# Patient Record
Sex: Female | Born: 1950 | Race: White | Hispanic: No | Marital: Married | Smoking: Current every day smoker
Health system: Southern US, Community
[De-identification: ages and names within clinical notes are randomized; demographics above are authoritative.]

## PROBLEM LIST (undated history)

## (undated) DIAGNOSIS — F329 Major depressive disorder, single episode, unspecified: Secondary | ICD-10-CM

## (undated) DIAGNOSIS — R251 Tremor, unspecified: Secondary | ICD-10-CM

## (undated) DIAGNOSIS — R0600 Dyspnea, unspecified: Secondary | ICD-10-CM

## (undated) DIAGNOSIS — N189 Chronic kidney disease, unspecified: Secondary | ICD-10-CM

## (undated) DIAGNOSIS — I714 Abdominal aortic aneurysm, without rupture, unspecified: Secondary | ICD-10-CM

## (undated) DIAGNOSIS — F39 Unspecified mood [affective] disorder: Secondary | ICD-10-CM

## (undated) DIAGNOSIS — E785 Hyperlipidemia, unspecified: Secondary | ICD-10-CM

## (undated) DIAGNOSIS — M549 Dorsalgia, unspecified: Secondary | ICD-10-CM

## (undated) DIAGNOSIS — J449 Chronic obstructive pulmonary disease, unspecified: Secondary | ICD-10-CM

## (undated) DIAGNOSIS — F419 Anxiety disorder, unspecified: Secondary | ICD-10-CM

## (undated) DIAGNOSIS — C801 Malignant (primary) neoplasm, unspecified: Secondary | ICD-10-CM

## (undated) DIAGNOSIS — I10 Essential (primary) hypertension: Secondary | ICD-10-CM

## (undated) DIAGNOSIS — M1990 Unspecified osteoarthritis, unspecified site: Secondary | ICD-10-CM

## (undated) HISTORY — DX: Tremor, unspecified: R25.1

## (undated) HISTORY — DX: Hyperlipidemia, unspecified: E78.5

## (undated) HISTORY — DX: Essential (primary) hypertension: I10

## (undated) HISTORY — DX: Dorsalgia, unspecified: M54.9

## (undated) HISTORY — DX: Unspecified mood (affective) disorder: F39

## (undated) HISTORY — DX: Chronic kidney disease, unspecified: N18.9

## (undated) HISTORY — DX: Abdominal aortic aneurysm, without rupture, unspecified: I71.40

## (undated) HISTORY — DX: Malignant (primary) neoplasm, unspecified: C80.1

## (undated) HISTORY — DX: Major depressive disorder, single episode, unspecified: F32.9

## (undated) HISTORY — DX: Chronic obstructive pulmonary disease, unspecified: J44.9

## (undated) HISTORY — DX: Abdominal aortic aneurysm, without rupture: I71.4

---

## 1982-08-03 DIAGNOSIS — J189 Pneumonia, unspecified organism: Secondary | ICD-10-CM

## 1982-08-03 HISTORY — DX: Pneumonia, unspecified organism: J18.9

## 2013-06-16 ENCOUNTER — Other Ambulatory Visit (HOSPITAL_COMMUNITY)
Admission: RE | Admit: 2013-06-16 | Discharge: 2013-06-16 | Disposition: A | Discharge status: Home / Self Care | Payer: Commercial Managed Care - PPO | Source: Ambulatory Visit | Attending: Family Medicine | Admitting: Family Medicine

## 2013-06-16 DIAGNOSIS — Z124 Encounter for screening for malignant neoplasm of cervix: Secondary | ICD-10-CM | POA: Insufficient documentation

## 2013-07-17 ENCOUNTER — Other Ambulatory Visit: Payer: Self-pay

## 2013-07-17 DIAGNOSIS — Z1231 Encounter for screening mammogram for malignant neoplasm of breast: Secondary | ICD-10-CM

## 2013-08-23 ENCOUNTER — Ambulatory Visit
Admission: RE | Admit: 2013-08-23 | Discharge: 2013-08-23 | Disposition: A | Discharge status: Home / Self Care | Payer: Commercial Managed Care - PPO | Source: Ambulatory Visit

## 2013-08-23 DIAGNOSIS — Z1231 Encounter for screening mammogram for malignant neoplasm of breast: Secondary | ICD-10-CM

## 2013-09-21 ENCOUNTER — Other Ambulatory Visit: Payer: Self-pay | Admitting: Family Medicine

## 2013-09-21 ENCOUNTER — Ambulatory Visit
Admission: RE | Admit: 2013-09-21 | Discharge: 2013-09-21 | Disposition: A | Discharge status: Home / Self Care | Payer: Commercial Managed Care - PPO | Source: Ambulatory Visit | Attending: Family Medicine | Admitting: Family Medicine

## 2013-09-21 DIAGNOSIS — E871 Hypo-osmolality and hyponatremia: Secondary | ICD-10-CM

## 2015-06-13 ENCOUNTER — Ambulatory Visit
Admission: RE | Admit: 2015-06-13 | Discharge: 2015-06-13 | Disposition: A | Discharge status: Home / Self Care | Payer: Medicare Other | Source: Ambulatory Visit | Attending: Family Medicine | Admitting: Family Medicine

## 2015-06-13 ENCOUNTER — Other Ambulatory Visit: Payer: Self-pay | Admitting: Family Medicine

## 2015-06-13 DIAGNOSIS — J984 Other disorders of lung: Secondary | ICD-10-CM

## 2015-10-07 ENCOUNTER — Ambulatory Visit
Admission: RE | Admit: 2015-10-07 | Discharge: 2015-10-07 | Disposition: A | Discharge status: Home / Self Care | Payer: Medicare Other | Source: Ambulatory Visit | Attending: Family Medicine | Admitting: Family Medicine

## 2015-10-07 ENCOUNTER — Other Ambulatory Visit: Payer: Self-pay | Admitting: Family Medicine

## 2015-10-07 DIAGNOSIS — J189 Pneumonia, unspecified organism: Secondary | ICD-10-CM

## 2016-08-04 DIAGNOSIS — N183 Chronic kidney disease, stage 3 (moderate): Secondary | ICD-10-CM | POA: Diagnosis not present

## 2016-08-04 DIAGNOSIS — Z0000 Encounter for general adult medical examination without abnormal findings: Secondary | ICD-10-CM | POA: Diagnosis not present

## 2016-08-04 DIAGNOSIS — I129 Hypertensive chronic kidney disease with stage 1 through stage 4 chronic kidney disease, or unspecified chronic kidney disease: Secondary | ICD-10-CM | POA: Diagnosis not present

## 2016-08-04 DIAGNOSIS — Z23 Encounter for immunization: Secondary | ICD-10-CM | POA: Diagnosis not present

## 2016-08-04 DIAGNOSIS — R69 Illness, unspecified: Secondary | ICD-10-CM | POA: Diagnosis not present

## 2016-08-04 DIAGNOSIS — Z720 Tobacco use: Secondary | ICD-10-CM | POA: Diagnosis not present

## 2016-08-04 DIAGNOSIS — E7800 Pure hypercholesterolemia, unspecified: Secondary | ICD-10-CM | POA: Diagnosis not present

## 2016-12-23 DIAGNOSIS — M79601 Pain in right arm: Secondary | ICD-10-CM | POA: Diagnosis not present

## 2016-12-23 DIAGNOSIS — R51 Headache: Secondary | ICD-10-CM | POA: Diagnosis not present

## 2016-12-23 DIAGNOSIS — M79604 Pain in right leg: Secondary | ICD-10-CM | POA: Diagnosis not present

## 2017-01-04 DIAGNOSIS — Z0100 Encounter for examination of eyes and vision without abnormal findings: Secondary | ICD-10-CM | POA: Diagnosis not present

## 2017-08-11 DIAGNOSIS — S20219A Contusion of unspecified front wall of thorax, initial encounter: Secondary | ICD-10-CM | POA: Diagnosis not present

## 2017-08-11 DIAGNOSIS — S8010XA Contusion of unspecified lower leg, initial encounter: Secondary | ICD-10-CM | POA: Diagnosis not present

## 2017-08-11 DIAGNOSIS — R69 Illness, unspecified: Secondary | ICD-10-CM | POA: Diagnosis not present

## 2017-08-16 DIAGNOSIS — H524 Presbyopia: Secondary | ICD-10-CM | POA: Diagnosis not present

## 2017-08-16 DIAGNOSIS — H5213 Myopia, bilateral: Secondary | ICD-10-CM | POA: Diagnosis not present

## 2017-08-16 DIAGNOSIS — H2513 Age-related nuclear cataract, bilateral: Secondary | ICD-10-CM | POA: Diagnosis not present

## 2017-08-17 DIAGNOSIS — C44529 Squamous cell carcinoma of skin of other part of trunk: Secondary | ICD-10-CM | POA: Diagnosis not present

## 2017-08-17 DIAGNOSIS — L821 Other seborrheic keratosis: Secondary | ICD-10-CM | POA: Diagnosis not present

## 2017-08-17 DIAGNOSIS — D485 Neoplasm of uncertain behavior of skin: Secondary | ICD-10-CM | POA: Diagnosis not present

## 2017-08-17 DIAGNOSIS — D1801 Hemangioma of skin and subcutaneous tissue: Secondary | ICD-10-CM | POA: Diagnosis not present

## 2017-09-23 DIAGNOSIS — R69 Illness, unspecified: Secondary | ICD-10-CM | POA: Diagnosis not present

## 2017-10-25 ENCOUNTER — Other Ambulatory Visit: Payer: Self-pay | Admitting: Family Medicine

## 2017-10-25 ENCOUNTER — Ambulatory Visit
Admission: RE | Admit: 2017-10-25 | Discharge: 2017-10-25 | Disposition: A | Discharge status: Home / Self Care | Payer: Medicare HMO | Source: Ambulatory Visit | Attending: Family Medicine | Admitting: Family Medicine

## 2017-10-25 DIAGNOSIS — R69 Illness, unspecified: Secondary | ICD-10-CM | POA: Diagnosis not present

## 2017-10-25 DIAGNOSIS — R0602 Shortness of breath: Secondary | ICD-10-CM | POA: Diagnosis not present

## 2017-10-25 DIAGNOSIS — C4490 Unspecified malignant neoplasm of skin, unspecified: Secondary | ICD-10-CM | POA: Diagnosis not present

## 2017-10-25 DIAGNOSIS — E7800 Pure hypercholesterolemia, unspecified: Secondary | ICD-10-CM | POA: Diagnosis not present

## 2017-10-25 DIAGNOSIS — N183 Chronic kidney disease, stage 3 (moderate): Secondary | ICD-10-CM | POA: Diagnosis not present

## 2017-10-25 DIAGNOSIS — Z720 Tobacco use: Secondary | ICD-10-CM | POA: Diagnosis not present

## 2017-10-25 DIAGNOSIS — Z1159 Encounter for screening for other viral diseases: Secondary | ICD-10-CM | POA: Diagnosis not present

## 2017-10-25 DIAGNOSIS — I129 Hypertensive chronic kidney disease with stage 1 through stage 4 chronic kidney disease, or unspecified chronic kidney disease: Secondary | ICD-10-CM | POA: Diagnosis not present

## 2017-10-25 DIAGNOSIS — R0600 Dyspnea, unspecified: Secondary | ICD-10-CM

## 2017-10-25 DIAGNOSIS — Z0000 Encounter for general adult medical examination without abnormal findings: Secondary | ICD-10-CM | POA: Diagnosis not present

## 2017-12-20 DIAGNOSIS — I10 Essential (primary) hypertension: Secondary | ICD-10-CM | POA: Diagnosis not present

## 2017-12-20 DIAGNOSIS — R251 Tremor, unspecified: Secondary | ICD-10-CM | POA: Diagnosis not present

## 2017-12-20 DIAGNOSIS — R0609 Other forms of dyspnea: Secondary | ICD-10-CM | POA: Diagnosis not present

## 2017-12-20 DIAGNOSIS — C44529 Squamous cell carcinoma of skin of other part of trunk: Secondary | ICD-10-CM | POA: Diagnosis not present

## 2017-12-20 DIAGNOSIS — E782 Mixed hyperlipidemia: Secondary | ICD-10-CM | POA: Diagnosis not present

## 2018-01-11 DIAGNOSIS — I10 Essential (primary) hypertension: Secondary | ICD-10-CM | POA: Diagnosis not present

## 2018-01-11 DIAGNOSIS — R0609 Other forms of dyspnea: Secondary | ICD-10-CM | POA: Diagnosis not present

## 2018-02-08 DIAGNOSIS — T7840XA Allergy, unspecified, initial encounter: Secondary | ICD-10-CM | POA: Diagnosis not present

## 2018-02-08 DIAGNOSIS — R21 Rash and other nonspecific skin eruption: Secondary | ICD-10-CM | POA: Diagnosis not present

## 2018-05-05 DIAGNOSIS — Z720 Tobacco use: Secondary | ICD-10-CM | POA: Diagnosis not present

## 2018-05-05 DIAGNOSIS — J449 Chronic obstructive pulmonary disease, unspecified: Secondary | ICD-10-CM | POA: Diagnosis not present

## 2018-05-05 DIAGNOSIS — R69 Illness, unspecified: Secondary | ICD-10-CM | POA: Diagnosis not present

## 2018-05-05 DIAGNOSIS — R251 Tremor, unspecified: Secondary | ICD-10-CM | POA: Diagnosis not present

## 2018-05-05 DIAGNOSIS — R0600 Dyspnea, unspecified: Secondary | ICD-10-CM | POA: Diagnosis not present

## 2018-07-06 ENCOUNTER — Ambulatory Visit: Payer: Medicare HMO | Admitting: Neurology

## 2019-01-11 DIAGNOSIS — D485 Neoplasm of uncertain behavior of skin: Secondary | ICD-10-CM | POA: Diagnosis not present

## 2019-01-11 DIAGNOSIS — D225 Melanocytic nevi of trunk: Secondary | ICD-10-CM | POA: Diagnosis not present

## 2019-01-11 DIAGNOSIS — L820 Inflamed seborrheic keratosis: Secondary | ICD-10-CM | POA: Diagnosis not present

## 2019-01-11 DIAGNOSIS — D1801 Hemangioma of skin and subcutaneous tissue: Secondary | ICD-10-CM | POA: Diagnosis not present

## 2019-01-11 DIAGNOSIS — Z85828 Personal history of other malignant neoplasm of skin: Secondary | ICD-10-CM | POA: Diagnosis not present

## 2019-01-18 DIAGNOSIS — H524 Presbyopia: Secondary | ICD-10-CM | POA: Diagnosis not present

## 2019-01-18 DIAGNOSIS — H5213 Myopia, bilateral: Secondary | ICD-10-CM | POA: Diagnosis not present

## 2019-01-18 DIAGNOSIS — H52201 Unspecified astigmatism, right eye: Secondary | ICD-10-CM | POA: Diagnosis not present

## 2019-01-18 DIAGNOSIS — H2513 Age-related nuclear cataract, bilateral: Secondary | ICD-10-CM | POA: Diagnosis not present

## 2019-01-19 ENCOUNTER — Ambulatory Visit: Payer: Medicare HMO | Admitting: Neurology

## 2019-01-19 DIAGNOSIS — Z1389 Encounter for screening for other disorder: Secondary | ICD-10-CM | POA: Diagnosis not present

## 2019-01-19 DIAGNOSIS — R69 Illness, unspecified: Secondary | ICD-10-CM | POA: Diagnosis not present

## 2019-01-19 DIAGNOSIS — Z720 Tobacco use: Secondary | ICD-10-CM | POA: Diagnosis not present

## 2019-01-19 DIAGNOSIS — N183 Chronic kidney disease, stage 3 (moderate): Secondary | ICD-10-CM | POA: Diagnosis not present

## 2019-01-19 DIAGNOSIS — R251 Tremor, unspecified: Secondary | ICD-10-CM | POA: Diagnosis not present

## 2019-01-19 DIAGNOSIS — E7800 Pure hypercholesterolemia, unspecified: Secondary | ICD-10-CM | POA: Diagnosis not present

## 2019-01-19 DIAGNOSIS — J449 Chronic obstructive pulmonary disease, unspecified: Secondary | ICD-10-CM | POA: Diagnosis not present

## 2019-01-19 DIAGNOSIS — Z0000 Encounter for general adult medical examination without abnormal findings: Secondary | ICD-10-CM | POA: Diagnosis not present

## 2019-01-19 DIAGNOSIS — I129 Hypertensive chronic kidney disease with stage 1 through stage 4 chronic kidney disease, or unspecified chronic kidney disease: Secondary | ICD-10-CM | POA: Diagnosis not present

## 2019-02-02 ENCOUNTER — Ambulatory Visit: Payer: Medicare HMO | Admitting: Neurology

## 2019-06-22 DIAGNOSIS — M722 Plantar fascial fibromatosis: Secondary | ICD-10-CM | POA: Diagnosis not present

## 2019-06-22 DIAGNOSIS — E569 Vitamin deficiency, unspecified: Secondary | ICD-10-CM | POA: Diagnosis not present

## 2019-06-22 DIAGNOSIS — M545 Low back pain: Secondary | ICD-10-CM | POA: Diagnosis not present

## 2019-06-22 DIAGNOSIS — N183 Chronic kidney disease, stage 3 unspecified: Secondary | ICD-10-CM | POA: Diagnosis not present

## 2019-07-04 ENCOUNTER — Other Ambulatory Visit: Payer: Self-pay | Admitting: Family Medicine

## 2019-07-04 DIAGNOSIS — Z139 Encounter for screening, unspecified: Secondary | ICD-10-CM

## 2019-09-24 ENCOUNTER — Ambulatory Visit: Payer: Medicare HMO | Attending: Internal Medicine

## 2019-09-24 DIAGNOSIS — Z23 Encounter for immunization: Secondary | ICD-10-CM | POA: Insufficient documentation

## 2019-09-24 NOTE — Progress Notes (Signed)
   Covid-19 Vaccination Clinic  Name:  Andrea Little    MRN: [id number] DOB: Mar 31, 1951  09/24/2019  [name] was observed post Covid-19 immunization for 15 minutes without incidence. She was provided with Vaccine Information Sheet and instruction to access the V-Safe system.   [name] was instructed to call 911 with any severe reactions post vaccine: Marland Kitchen Difficulty breathing  . Swelling of your face and throat  . A fast heartbeat  . A bad rash all over your body  . Dizziness and weakness    Immunizations Administered    Name Date Dose VIS Date Route   Pfizer COVID-19 Vaccine 09/24/2019  1:17 PM 0.3 mL 07/14/2019 Intramuscular   Manufacturer: [place]   Lot: [id number]   [name]: [id number]

## 2019-09-28 DIAGNOSIS — M9905 Segmental and somatic dysfunction of pelvic region: Secondary | ICD-10-CM | POA: Diagnosis not present

## 2019-09-28 DIAGNOSIS — M9904 Segmental and somatic dysfunction of sacral region: Secondary | ICD-10-CM | POA: Diagnosis not present

## 2019-09-28 DIAGNOSIS — M9903 Segmental and somatic dysfunction of lumbar region: Secondary | ICD-10-CM | POA: Diagnosis not present

## 2019-09-28 DIAGNOSIS — M5136 Other intervertebral disc degeneration, lumbar region: Secondary | ICD-10-CM | POA: Diagnosis not present

## 2019-10-03 DIAGNOSIS — M9905 Segmental and somatic dysfunction of pelvic region: Secondary | ICD-10-CM | POA: Diagnosis not present

## 2019-10-03 DIAGNOSIS — M9904 Segmental and somatic dysfunction of sacral region: Secondary | ICD-10-CM | POA: Diagnosis not present

## 2019-10-03 DIAGNOSIS — M9903 Segmental and somatic dysfunction of lumbar region: Secondary | ICD-10-CM | POA: Diagnosis not present

## 2019-10-03 DIAGNOSIS — M5136 Other intervertebral disc degeneration, lumbar region: Secondary | ICD-10-CM | POA: Diagnosis not present

## 2019-10-10 DIAGNOSIS — M9905 Segmental and somatic dysfunction of pelvic region: Secondary | ICD-10-CM | POA: Diagnosis not present

## 2019-10-10 DIAGNOSIS — M9904 Segmental and somatic dysfunction of sacral region: Secondary | ICD-10-CM | POA: Diagnosis not present

## 2019-10-10 DIAGNOSIS — M5136 Other intervertebral disc degeneration, lumbar region: Secondary | ICD-10-CM | POA: Diagnosis not present

## 2019-10-10 DIAGNOSIS — M9903 Segmental and somatic dysfunction of lumbar region: Secondary | ICD-10-CM | POA: Diagnosis not present

## 2019-10-18 ENCOUNTER — Ambulatory Visit: Payer: Medicare HMO

## 2019-11-06 ENCOUNTER — Ambulatory Visit: Payer: Medicare HMO | Attending: Internal Medicine

## 2019-11-06 DIAGNOSIS — Z23 Encounter for immunization: Secondary | ICD-10-CM

## 2019-11-06 NOTE — Progress Notes (Signed)
   Covid-19 Vaccination Clinic  Name:  Andrea Little    MRN: [id number] DOB: 15-May-1951  11/06/2019  Andrea Little was observed post Covid-19 immunization for 15 minutes without incident. She was provided with Vaccine Information Sheet and instruction to access the V-Safe system.   Andrea Little was instructed to call 911 with any severe reactions post vaccine: Marland Kitchen Difficulty breathing  . Swelling of face and throat  . A fast heartbeat  . A bad rash all over body  . Dizziness and weakness   Immunizations Administered    Name Date Dose VIS Date Route   Pfizer COVID-19 Vaccine 11/06/2019  1:22 PM 0.3 mL 07/14/2019 Intramuscular   Manufacturer: [employer], [employer]   Lot: [id number]   [place]: [id number]

## 2019-12-14 DIAGNOSIS — I129 Hypertensive chronic kidney disease with stage 1 through stage 4 chronic kidney disease, or unspecified chronic kidney disease: Secondary | ICD-10-CM | POA: Diagnosis not present

## 2019-12-14 DIAGNOSIS — E559 Vitamin D deficiency, unspecified: Secondary | ICD-10-CM | POA: Diagnosis not present

## 2019-12-14 DIAGNOSIS — N183 Chronic kidney disease, stage 3 unspecified: Secondary | ICD-10-CM | POA: Diagnosis not present

## 2019-12-14 DIAGNOSIS — R5383 Other fatigue: Secondary | ICD-10-CM | POA: Diagnosis not present

## 2019-12-14 DIAGNOSIS — R69 Illness, unspecified: Secondary | ICD-10-CM | POA: Diagnosis not present

## 2019-12-14 DIAGNOSIS — M545 Low back pain: Secondary | ICD-10-CM | POA: Diagnosis not present

## 2020-01-10 DIAGNOSIS — M5442 Lumbago with sciatica, left side: Secondary | ICD-10-CM | POA: Diagnosis not present

## 2020-01-10 DIAGNOSIS — M47816 Spondylosis without myelopathy or radiculopathy, lumbar region: Secondary | ICD-10-CM | POA: Diagnosis not present

## 2020-01-10 DIAGNOSIS — M5441 Lumbago with sciatica, right side: Secondary | ICD-10-CM | POA: Diagnosis not present

## 2020-01-10 DIAGNOSIS — M5136 Other intervertebral disc degeneration, lumbar region: Secondary | ICD-10-CM | POA: Diagnosis not present

## 2020-01-10 DIAGNOSIS — M545 Low back pain: Secondary | ICD-10-CM | POA: Diagnosis not present

## 2020-01-10 DIAGNOSIS — M8580 Other specified disorders of bone density and structure, unspecified site: Secondary | ICD-10-CM | POA: Diagnosis not present

## 2020-01-10 DIAGNOSIS — G8929 Other chronic pain: Secondary | ICD-10-CM | POA: Diagnosis not present

## 2020-01-10 DIAGNOSIS — R202 Paresthesia of skin: Secondary | ICD-10-CM | POA: Diagnosis not present

## 2020-01-12 DIAGNOSIS — M810 Age-related osteoporosis without current pathological fracture: Secondary | ICD-10-CM | POA: Diagnosis not present

## 2020-01-12 DIAGNOSIS — Z780 Asymptomatic menopausal state: Secondary | ICD-10-CM | POA: Diagnosis not present

## 2020-01-12 DIAGNOSIS — M8588 Other specified disorders of bone density and structure, other site: Secondary | ICD-10-CM | POA: Diagnosis not present

## 2020-01-22 DIAGNOSIS — R69 Illness, unspecified: Secondary | ICD-10-CM | POA: Diagnosis not present

## 2020-01-22 DIAGNOSIS — J449 Chronic obstructive pulmonary disease, unspecified: Secondary | ICD-10-CM | POA: Diagnosis not present

## 2020-01-22 DIAGNOSIS — N183 Chronic kidney disease, stage 3 unspecified: Secondary | ICD-10-CM | POA: Diagnosis not present

## 2020-01-22 DIAGNOSIS — C4490 Unspecified malignant neoplasm of skin, unspecified: Secondary | ICD-10-CM | POA: Diagnosis not present

## 2020-01-22 DIAGNOSIS — E7800 Pure hypercholesterolemia, unspecified: Secondary | ICD-10-CM | POA: Diagnosis not present

## 2020-01-22 DIAGNOSIS — Z1389 Encounter for screening for other disorder: Secondary | ICD-10-CM | POA: Diagnosis not present

## 2020-01-22 DIAGNOSIS — I129 Hypertensive chronic kidney disease with stage 1 through stage 4 chronic kidney disease, or unspecified chronic kidney disease: Secondary | ICD-10-CM | POA: Diagnosis not present

## 2020-01-22 DIAGNOSIS — Z0000 Encounter for general adult medical examination without abnormal findings: Secondary | ICD-10-CM | POA: Diagnosis not present

## 2020-01-22 DIAGNOSIS — Z23 Encounter for immunization: Secondary | ICD-10-CM | POA: Diagnosis not present

## 2020-02-02 DIAGNOSIS — M8588 Other specified disorders of bone density and structure, other site: Secondary | ICD-10-CM | POA: Diagnosis not present

## 2020-02-02 DIAGNOSIS — E559 Vitamin D deficiency, unspecified: Secondary | ICD-10-CM | POA: Diagnosis not present

## 2020-02-02 DIAGNOSIS — Z7189 Other specified counseling: Secondary | ICD-10-CM | POA: Diagnosis not present

## 2020-02-07 DIAGNOSIS — M5386 Other specified dorsopathies, lumbar region: Secondary | ICD-10-CM | POA: Diagnosis not present

## 2020-02-07 DIAGNOSIS — Z789 Other specified health status: Secondary | ICD-10-CM | POA: Diagnosis not present

## 2020-02-07 DIAGNOSIS — M5136 Other intervertebral disc degeneration, lumbar region: Secondary | ICD-10-CM | POA: Diagnosis not present

## 2020-02-07 DIAGNOSIS — M47816 Spondylosis without myelopathy or radiculopathy, lumbar region: Secondary | ICD-10-CM | POA: Diagnosis not present

## 2020-02-07 DIAGNOSIS — G8929 Other chronic pain: Secondary | ICD-10-CM | POA: Diagnosis not present

## 2020-02-07 DIAGNOSIS — Z7409 Other reduced mobility: Secondary | ICD-10-CM | POA: Diagnosis not present

## 2020-02-07 DIAGNOSIS — M5441 Lumbago with sciatica, right side: Secondary | ICD-10-CM | POA: Diagnosis not present

## 2020-02-07 DIAGNOSIS — R29898 Other symptoms and signs involving the musculoskeletal system: Secondary | ICD-10-CM | POA: Diagnosis not present

## 2020-02-07 DIAGNOSIS — M5442 Lumbago with sciatica, left side: Secondary | ICD-10-CM | POA: Diagnosis not present

## 2020-02-07 DIAGNOSIS — M6281 Muscle weakness (generalized): Secondary | ICD-10-CM | POA: Diagnosis not present

## 2020-05-01 DIAGNOSIS — R69 Illness, unspecified: Secondary | ICD-10-CM | POA: Diagnosis not present

## 2020-05-12 DIAGNOSIS — M545 Low back pain, unspecified: Secondary | ICD-10-CM | POA: Diagnosis not present

## 2020-05-12 DIAGNOSIS — M5136 Other intervertebral disc degeneration, lumbar region: Secondary | ICD-10-CM | POA: Diagnosis not present

## 2020-05-12 DIAGNOSIS — R202 Paresthesia of skin: Secondary | ICD-10-CM | POA: Diagnosis not present

## 2020-05-12 DIAGNOSIS — M5442 Lumbago with sciatica, left side: Secondary | ICD-10-CM | POA: Diagnosis not present

## 2020-05-12 DIAGNOSIS — M5441 Lumbago with sciatica, right side: Secondary | ICD-10-CM | POA: Diagnosis not present

## 2020-05-12 DIAGNOSIS — G8929 Other chronic pain: Secondary | ICD-10-CM | POA: Diagnosis not present

## 2020-05-17 DIAGNOSIS — M5136 Other intervertebral disc degeneration, lumbar region: Secondary | ICD-10-CM | POA: Insufficient documentation

## 2020-05-17 DIAGNOSIS — M545 Low back pain, unspecified: Secondary | ICD-10-CM | POA: Diagnosis not present

## 2020-05-17 DIAGNOSIS — M47816 Spondylosis without myelopathy or radiculopathy, lumbar region: Secondary | ICD-10-CM | POA: Diagnosis not present

## 2020-05-17 DIAGNOSIS — M48062 Spinal stenosis, lumbar region with neurogenic claudication: Secondary | ICD-10-CM | POA: Diagnosis not present

## 2020-05-17 DIAGNOSIS — M51369 Other intervertebral disc degeneration, lumbar region without mention of lumbar back pain or lower extremity pain: Secondary | ICD-10-CM | POA: Insufficient documentation

## 2020-05-17 DIAGNOSIS — M79604 Pain in right leg: Secondary | ICD-10-CM | POA: Diagnosis not present

## 2020-06-04 DIAGNOSIS — M4726 Other spondylosis with radiculopathy, lumbar region: Secondary | ICD-10-CM | POA: Diagnosis not present

## 2020-06-04 DIAGNOSIS — M5116 Intervertebral disc disorders with radiculopathy, lumbar region: Secondary | ICD-10-CM | POA: Diagnosis not present

## 2020-06-04 DIAGNOSIS — M79604 Pain in right leg: Secondary | ICD-10-CM | POA: Insufficient documentation

## 2020-06-04 DIAGNOSIS — Z79899 Other long term (current) drug therapy: Secondary | ICD-10-CM | POA: Diagnosis not present

## 2020-06-04 DIAGNOSIS — I10 Essential (primary) hypertension: Secondary | ICD-10-CM | POA: Diagnosis not present

## 2020-06-04 DIAGNOSIS — M5126 Other intervertebral disc displacement, lumbar region: Secondary | ICD-10-CM | POA: Diagnosis not present

## 2020-06-04 DIAGNOSIS — M545 Low back pain, unspecified: Secondary | ICD-10-CM | POA: Insufficient documentation

## 2020-07-04 DIAGNOSIS — M48062 Spinal stenosis, lumbar region with neurogenic claudication: Secondary | ICD-10-CM | POA: Diagnosis not present

## 2020-07-04 DIAGNOSIS — M47816 Spondylosis without myelopathy or radiculopathy, lumbar region: Secondary | ICD-10-CM | POA: Diagnosis not present

## 2020-07-04 DIAGNOSIS — M545 Low back pain, unspecified: Secondary | ICD-10-CM | POA: Diagnosis not present

## 2020-07-04 DIAGNOSIS — M5136 Other intervertebral disc degeneration, lumbar region: Secondary | ICD-10-CM | POA: Diagnosis not present

## 2020-07-04 DIAGNOSIS — M79604 Pain in right leg: Secondary | ICD-10-CM | POA: Diagnosis not present

## 2020-07-16 DIAGNOSIS — I719 Aortic aneurysm of unspecified site, without rupture: Secondary | ICD-10-CM | POA: Diagnosis not present

## 2020-07-16 DIAGNOSIS — E7800 Pure hypercholesterolemia, unspecified: Secondary | ICD-10-CM | POA: Diagnosis not present

## 2020-07-16 DIAGNOSIS — R109 Unspecified abdominal pain: Secondary | ICD-10-CM | POA: Diagnosis not present

## 2020-07-16 DIAGNOSIS — I129 Hypertensive chronic kidney disease with stage 1 through stage 4 chronic kidney disease, or unspecified chronic kidney disease: Secondary | ICD-10-CM | POA: Diagnosis not present

## 2020-07-16 DIAGNOSIS — E559 Vitamin D deficiency, unspecified: Secondary | ICD-10-CM | POA: Diagnosis not present

## 2020-08-06 DIAGNOSIS — M47816 Spondylosis without myelopathy or radiculopathy, lumbar region: Secondary | ICD-10-CM | POA: Diagnosis not present

## 2020-08-20 ENCOUNTER — Other Ambulatory Visit: Payer: Self-pay

## 2020-08-20 DIAGNOSIS — I714 Abdominal aortic aneurysm, without rupture, unspecified: Secondary | ICD-10-CM

## 2020-09-06 ENCOUNTER — Encounter: Payer: Medicare HMO | Admitting: Vascular Surgery

## 2020-09-06 ENCOUNTER — Other Ambulatory Visit (HOSPITAL_COMMUNITY): Payer: Medicare HMO

## 2020-10-18 ENCOUNTER — Ambulatory Visit (HOSPITAL_COMMUNITY)
Admission: RE | Admit: 2020-10-18 | Discharge: 2020-10-18 | Disposition: A | Discharge status: Home / Self Care | Payer: Medicare HMO | Source: Ambulatory Visit | Attending: Vascular Surgery | Admitting: Vascular Surgery

## 2020-10-18 ENCOUNTER — Other Ambulatory Visit: Payer: Self-pay

## 2020-10-18 ENCOUNTER — Ambulatory Visit (INDEPENDENT_AMBULATORY_CARE_PROVIDER_SITE_OTHER): Payer: Medicare HMO | Admitting: Vascular Surgery

## 2020-10-18 ENCOUNTER — Encounter: Payer: Self-pay | Admitting: Vascular Surgery

## 2020-10-18 VITALS — BP 128/74 | HR 58 | Temp 97.7°F | Resp 20 | Ht 70.0 in | Wt 158.0 lb

## 2020-10-18 DIAGNOSIS — I714 Abdominal aortic aneurysm, without rupture, unspecified: Secondary | ICD-10-CM

## 2020-10-18 NOTE — Progress Notes (Signed)
Patient ID: Andrea Little, female   DOB: 12/22/1950, 70 y.o.   MRN: [id number]  Reason for Consult: New Patient (Initial Visit)   Referred by Kelton Pillar, MD  Subjective:     HPI:  Andrea Little is a 70 y.o. female with history of back pain and recently found to have abdominal aortic aneurysm on MRI.  She has risk factors of smoking, hyperlipidemia hypertension.  She did previously have right lower extremity pain but after injection in her back she does not have any further pain.  She denies tissue loss or ulceration.  She is unaware of any family history of aneurysm disease.  She denies previous stroke, TIA or amaurosis  Past Medical History:  Diagnosis Date  . AAA (abdominal aortic aneurysm) ([place])   . Back pain   . Cancer ([place])    skin  . Chronic kidney disease   . COPD (chronic obstructive pulmonary disease) ([place])   . Hyperlipidemia   . Hypertension   . Major depression   . Mood disorder ([place])   . Tremor    History reviewed. No pertinent family history. History reviewed. No pertinent surgical history.  Short Social History:  Social History   Tobacco Use  . Smoking status: Current Every Day Smoker    Packs/day: 1.00  . Smokeless tobacco: Never Used  Substance Use Topics  . Alcohol use: Not Currently    Allergies  Allergen Reactions  . Atorvastatin     Other reaction(s): leg cramps  . Hydrochlorothiazide     Other reaction(s): hyponatremia  . Penicillin G     Other reaction(s): Other (See Comments) As a child does not recall the reaction     Current Outpatient Medications  Medication Sig Dispense Refill  . acetaminophen (TYLENOL) 500 MG tablet 2 tablets as needed    . ALPRAZolam (XANAX) 1 MG tablet Take 1 mg by mouth 3 (three) times daily as needed.    Marland Kitchen amLODipine (NORVASC) 5 MG tablet Take 5 mg by mouth daily.    Jearl Klinefelter ELLIPTA 62.5-25 MCG/INH AEPB 1 puff daily.    Marland Kitchen atenolol (TENORMIN) 100 MG tablet Take 100 mg by mouth daily.    . benazepril  (LOTENSIN) 40 MG tablet Take 40 mg by mouth daily.    . Calcium Carbonate-Vit D-Min (CALCIUM 1200) 1200-1000 MG-UNIT CHEW 1 tablet    . venlafaxine XR (EFFEXOR-XR) 150 MG 24 hr capsule Take 150 mg by mouth daily. with food    . Vitamin D, Ergocalciferol, (DRISDOL) 1.25 MG (50000 UNIT) CAPS capsule Take 50,000 Units by mouth once a week.    . Vitamin D, Ergocalciferol, (DRISDOL) 1.25 MG (50000 UNIT) CAPS capsule 1 capsule     No current facility-administered medications for this visit.    Review of Systems  Constitutional:  Constitutional negative. HENT: HENT negative.  Eyes: Eyes negative.  Respiratory: Respiratory negative.  Cardiovascular: Cardiovascular negative.  GI: Gastrointestinal negative.  Musculoskeletal: Musculoskeletal negative.  Skin: Skin negative.  Neurological: Neurological negative. Hematologic: Hematologic/lymphatic negative.  Psychiatric: Psychiatric negative.        Objective:  Objective  Vitals:   10/18/20 0959  BP: 128/74  Pulse: (!) 58  Resp: 20  Temp: 97.7 F (36.5 C)  SpO2: 96%    Physical Exam HENT:     Head: Normocephalic.     Nose:     Comments: Wearing a mask Eyes:     Pupils: Pupils are equal, round, and reactive to light.  Neck:  Vascular: No carotid bruit.  Cardiovascular:     Rate and Rhythm: Normal rate.     Pulses:          Carotid pulses are 2+ on the right side and 2+ on the left side.      Radial pulses are 2+ on the right side and 2+ on the left side.       Popliteal pulses are 0 on the right side and 0 on the left side.     Heart sounds: Normal heart sounds.  Pulmonary:     Effort: Pulmonary effort is normal.     Breath sounds: Normal breath sounds.  Abdominal:     Palpations: Abdomen is soft. There is no mass.  Musculoskeletal:        General: Normal range of motion.     Right lower leg: No edema.     Left lower leg: No edema.  Skin:    General: Skin is warm.     Capillary Refill: Capillary refill takes less  than 2 seconds.  Neurological:     General: No focal deficit present.     Mental Status: She is alert.  Psychiatric:        Mood and Affect: Mood normal.        Behavior: Behavior normal.        Thought Content: Thought content normal.        Judgment: Judgment normal.     Data: Abdominal Aorta Findings:  +-----------+-------+----------+----------+--------+--------+--------+  Location  AP (cm)Trans (cm)PSV (cm/s)WaveformThrombusComments  +-----------+-------+----------+----------+--------+--------+--------+  Proximal  1.73  1.97   73                  +-----------+-------+----------+----------+--------+--------+--------+  Mid    3.14  3.44   40                  +-----------+-------+----------+----------+--------+--------+--------+  Distal   1.52  1.66   69                  +-----------+-------+----------+----------+--------+--------+--------+  RT CIA Prox0.9  0.9    109                  +-----------+-------+----------+----------+--------+--------+--------+  LT CIA Prox0.9  0.9    176                  +-----------+-------+----------+----------+--------+--------+--------+    Summary:  Abdominal Aorta: There is evidence of abnormal dilatation of the mid  Abdominal aorta. The largest aortic measurement is 3.4 cm.        Assessment/Plan:    70 year old female here for evaluation of abdominal aortic aneurysm which is small.  This was found incidentally on MRI.  We will follow her up in 2 years with repeat AAA duplex.  We will check ABIs at that time as well although she appears asymptomatic she does not have palpable distal pulses.  I did discuss the signs of rupture as well as the need for smoking cessation and she demonstrates good understanding.      [name] MD [facility]

## 2020-11-06 DIAGNOSIS — H2513 Age-related nuclear cataract, bilateral: Secondary | ICD-10-CM | POA: Diagnosis not present

## 2020-11-06 DIAGNOSIS — H52201 Unspecified astigmatism, right eye: Secondary | ICD-10-CM | POA: Diagnosis not present

## 2020-11-06 DIAGNOSIS — H5213 Myopia, bilateral: Secondary | ICD-10-CM | POA: Diagnosis not present

## 2020-11-06 DIAGNOSIS — H25013 Cortical age-related cataract, bilateral: Secondary | ICD-10-CM | POA: Diagnosis not present

## 2020-11-06 DIAGNOSIS — H524 Presbyopia: Secondary | ICD-10-CM | POA: Diagnosis not present

## 2021-01-22 DIAGNOSIS — I719 Aortic aneurysm of unspecified site, without rupture: Secondary | ICD-10-CM | POA: Diagnosis not present

## 2021-01-22 DIAGNOSIS — E559 Vitamin D deficiency, unspecified: Secondary | ICD-10-CM | POA: Diagnosis not present

## 2021-01-22 DIAGNOSIS — Z1389 Encounter for screening for other disorder: Secondary | ICD-10-CM | POA: Diagnosis not present

## 2021-01-22 DIAGNOSIS — R69 Illness, unspecified: Secondary | ICD-10-CM | POA: Diagnosis not present

## 2021-01-22 DIAGNOSIS — E7800 Pure hypercholesterolemia, unspecified: Secondary | ICD-10-CM | POA: Diagnosis not present

## 2021-01-22 DIAGNOSIS — I700 Atherosclerosis of aorta: Secondary | ICD-10-CM | POA: Diagnosis not present

## 2021-01-22 DIAGNOSIS — Z0000 Encounter for general adult medical examination without abnormal findings: Secondary | ICD-10-CM | POA: Diagnosis not present

## 2021-01-22 DIAGNOSIS — I129 Hypertensive chronic kidney disease with stage 1 through stage 4 chronic kidney disease, or unspecified chronic kidney disease: Secondary | ICD-10-CM | POA: Diagnosis not present

## 2021-01-22 DIAGNOSIS — N183 Chronic kidney disease, stage 3 unspecified: Secondary | ICD-10-CM | POA: Diagnosis not present

## 2021-01-22 DIAGNOSIS — F39 Unspecified mood [affective] disorder: Secondary | ICD-10-CM | POA: Diagnosis not present

## 2021-01-22 DIAGNOSIS — J449 Chronic obstructive pulmonary disease, unspecified: Secondary | ICD-10-CM | POA: Diagnosis not present

## 2021-01-22 DIAGNOSIS — F324 Major depressive disorder, single episode, in partial remission: Secondary | ICD-10-CM | POA: Diagnosis not present

## 2021-02-26 ENCOUNTER — Institutional Professional Consult (permissible substitution): Payer: Medicare HMO | Admitting: Pulmonary Disease

## 2021-02-27 ENCOUNTER — Encounter: Payer: Self-pay | Admitting: Anesthesiology

## 2021-03-27 ENCOUNTER — Institutional Professional Consult (permissible substitution): Payer: Medicare HMO | Admitting: Pulmonary Disease

## 2021-03-31 DIAGNOSIS — G8929 Other chronic pain: Secondary | ICD-10-CM | POA: Diagnosis not present

## 2021-03-31 DIAGNOSIS — M545 Low back pain, unspecified: Secondary | ICD-10-CM | POA: Diagnosis not present

## 2021-03-31 DIAGNOSIS — M79604 Pain in right leg: Secondary | ICD-10-CM | POA: Diagnosis not present

## 2021-03-31 DIAGNOSIS — M47816 Spondylosis without myelopathy or radiculopathy, lumbar region: Secondary | ICD-10-CM | POA: Diagnosis not present

## 2021-03-31 DIAGNOSIS — M25551 Pain in right hip: Secondary | ICD-10-CM | POA: Diagnosis not present

## 2021-04-15 DIAGNOSIS — E871 Hypo-osmolality and hyponatremia: Secondary | ICD-10-CM | POA: Diagnosis not present

## 2021-04-17 ENCOUNTER — Other Ambulatory Visit: Payer: Self-pay | Admitting: Family Medicine

## 2021-04-17 ENCOUNTER — Ambulatory Visit
Admission: RE | Admit: 2021-04-17 | Discharge: 2021-04-17 | Disposition: A | Discharge status: Home / Self Care | Payer: Medicare HMO | Source: Ambulatory Visit | Attending: Family Medicine | Admitting: Family Medicine

## 2021-04-17 DIAGNOSIS — R0602 Shortness of breath: Secondary | ICD-10-CM | POA: Diagnosis not present

## 2021-04-17 DIAGNOSIS — R053 Chronic cough: Secondary | ICD-10-CM | POA: Diagnosis not present

## 2021-04-17 DIAGNOSIS — E871 Hypo-osmolality and hyponatremia: Secondary | ICD-10-CM

## 2021-04-17 DIAGNOSIS — R059 Cough, unspecified: Secondary | ICD-10-CM | POA: Diagnosis not present

## 2021-04-30 ENCOUNTER — Institutional Professional Consult (permissible substitution): Payer: Medicare HMO | Admitting: Pulmonary Disease

## 2021-05-09 DIAGNOSIS — I10 Essential (primary) hypertension: Secondary | ICD-10-CM | POA: Diagnosis not present

## 2021-05-09 DIAGNOSIS — Z720 Tobacco use: Secondary | ICD-10-CM | POA: Diagnosis not present

## 2021-05-09 DIAGNOSIS — J309 Allergic rhinitis, unspecified: Secondary | ICD-10-CM | POA: Diagnosis not present

## 2021-05-09 DIAGNOSIS — J449 Chronic obstructive pulmonary disease, unspecified: Secondary | ICD-10-CM | POA: Diagnosis not present

## 2021-05-20 ENCOUNTER — Telehealth: Payer: Self-pay | Admitting: Pulmonary Disease

## 2021-05-20 NOTE — Telephone Encounter (Signed)
Patient scheduled 05/28/21 with Dr. Silas Flood for COPD.  Per COPD office protocol cbc with diff is needed. I called and spoke with Patient.  Patient stated she had recent labs at her PCP and did not want to have labs drawn at this time. I called [facility] and spoke with Joaquim Lai.  Joaquim Lai is faxing Patient recent labs for OV.

## 2021-05-28 ENCOUNTER — Other Ambulatory Visit: Payer: Self-pay

## 2021-05-28 ENCOUNTER — Encounter: Payer: Self-pay | Admitting: Pulmonary Disease

## 2021-05-28 ENCOUNTER — Ambulatory Visit (INDEPENDENT_AMBULATORY_CARE_PROVIDER_SITE_OTHER): Payer: Medicare HMO | Admitting: Pulmonary Disease

## 2021-05-28 VITALS — BP 130/96 | HR 60 | Temp 98.3°F | Ht 70.0 in | Wt 156.0 lb

## 2021-05-28 DIAGNOSIS — R0609 Other forms of dyspnea: Secondary | ICD-10-CM | POA: Diagnosis not present

## 2021-05-28 NOTE — Patient Instructions (Addendum)
Nice to meet you  We will get pulmonary function tests in the coming days to further evaluate your shortness of breath  Continue Anoro   Based on results of the test we may need to change inhalers  We should consider pulmonary rehab if able.  Slowly increase your exercise - stat with a 5 minute walk at least 3-5 times a week for 2 weeks. Then increase to 10 minutes for 2 weeks. Then continue to increase time walking every 2 weeks to whatever goal you desire. Ideally around 30 minutes or so.  Return to clinic in 3 months or sooner as needed with Dr. Silas Flood

## 2021-05-28 NOTE — Progress Notes (Signed)
@Patient  ID: Andrea Little, female    DOB: 11/06/1950, 70 y.o.   MRN: [id number]  Chief Complaint  Patient presents with   Consult    DOE    Referring provider: Kelton Pillar, MD  HPI:   70 y.o. woman whom we are seeing in consultation for evaluation of dyspnea on exertion.  Note from referring provider reviewed.  Overall, she describes dyspnea on exertion present for last 2 to 3 years.  Gradually worsening.  Used to be more active.  Now rarely leaves the house.  Does not really exert self at all.  Gets dyspneic vacuuming 1 room.  Worse on inclines or stairs.  No time of day when things are better or worse.  No position to make things better or worse.  No seasonal environmental factors she can identify to make things better or worse.  She has been prescribed Anoro.  She does feel active helps with her symptoms.  No other relieving or exacerbating factors.  She continues to smoke, over 50-pack-year smoking history.  Chest x-ray 10/2017 reviewed interpreted as clear lungs with hyperinflation.  Most recent chest imaging chest x-ray 04/2021 reviewed interpreted as clear lungs with hyperinflation, no changes compared to 2019.  She reports echocardiogram in the recent past.  Cannot see results of this.  She states things looked okay per her understanding of the report.  She endorses ongoing anxiety symptoms.  Son is major trigger.  Prescribed venlafaxine as well as alprazolam 1 mg 3 times daily as needed.  She feels anxiety makes her symptoms worse.  PMH: Hypertension, emphysema, hyperlipidemia, seasonal allergies, anxiety Surgical history: Reviewed with patient, she denies any Family history: Emphysema in father, CAD in mother Social history: Current smoker, 1 pack a day, approximate 53-pack-year history, grew up in [place], has lived in [place] since 2008, son still lives in [place] / Pulmonary Flowsheets:   ACT:  No flowsheet data found.  MMRC: No flowsheet data  found.  Epworth:  No flowsheet data found.  Tests:   FENO:  No results found for: NITRICOXIDE  PFT: No flowsheet data found.  WALK:  No flowsheet data found.  Imaging: Personally reviewed and as per EMR discussion this note  Lab Results:  CBC Personally reviewed results 02/2020 via care everywhere No results found for: WBC, RBC, HGB, HCT, PLT, MCV, MCH, MCHC, RDW, LYMPHSABS, MONOABS, EOSABS, BASOSABS  BMET No results found for: NA, K, CL, CO2, GLUCOSE, BUN, CREATININE, CALCIUM, GFRNONAA, GFRAA  BNP No results found for: BNP  ProBNP No results found for: PROBNP  Specialty Problems   None  Allergies  Allergen Reactions   Atorvastatin     Other reaction(s): leg cramps   Hydrochlorothiazide     Other reaction(s): hyponatremia   Penicillin G     Other reaction(s): Other (See Comments) As a child does not recall the reaction     Immunization History  Administered Date(s) Administered   PFIZER(Purple Top)SARS-COV-2 Vaccination 09/24/2019, 11/06/2019, 06/13/2020   Pneumococcal Conjugate-13 08/04/2016   Pneumococcal Polysaccharide-23 01/22/2020   Tdap 06/16/2013    Past Medical History:  Diagnosis Date   AAA (abdominal aortic aneurysm)    Back pain    Cancer ([facility])    skin   Chronic kidney disease    COPD (chronic obstructive pulmonary disease) ([place])    Hyperlipidemia    Hypertension    Major depression    Mood disorder ([place])    Tremor     Tobacco History: Social History  Tobacco Use  Smoking Status Every Day   Packs/day: 1.00   Types: Cigarettes  Smokeless Tobacco Never   Ready to quit: Not Answered Counseling given: Not Answered   Continue to not smoke  Outpatient Encounter Medications as of 05/28/2021  Medication Sig   acetaminophen (TYLENOL) 500 MG tablet 2 tablets as needed   ALPRAZolam (XANAX) 1 MG tablet Take 1 mg by mouth 3 (three) times daily as needed.   amLODipine (NORVASC) 5 MG tablet Take 5 mg by mouth daily.   atenolol  (TENORMIN) 100 MG tablet Take 100 mg by mouth daily.   benazepril (LOTENSIN) 40 MG tablet Take 40 mg by mouth daily.   Calcium Carbonate-Vit D-Min (CALCIUM 1200) 1200-1000 MG-UNIT CHEW 1 tablet   fexofenadine (ALLEGRA) 180 MG tablet Take 180 mg by mouth daily as needed.   venlafaxine XR (EFFEXOR-XR) 150 MG 24 hr capsule Take 150 mg by mouth daily. with food   Vitamin D, Ergocalciferol, (DRISDOL) 1.25 MG (50000 UNIT) CAPS capsule Take 50,000 Units by mouth once a week.   Vitamin D, Ergocalciferol, (DRISDOL) 1.25 MG (50000 UNIT) CAPS capsule 1 capsule   ANORO ELLIPTA 62.5-25 MCG/INH AEPB 1 puff daily.   fluticasone (FLONASE) 50 MCG/ACT nasal spray SMARTSIG:2 Spray(s) Both Nares Daily PRN (Patient not taking: Reported on 05/28/2021)   No facility-administered encounter medications on file as of 05/28/2021.     Review of Systems  Review of Systems  No chest with exertion.  No orthopnea or PND.  Intermittent lower extremity swelling that worsens of the day, improves by morning with elevation.  Comprehensive review of systems otherwise negative. Physical Exam  BP (!) 130/96 (BP Location: Left Arm, Cuff Size: Normal)   Pulse 60   Temp 98.3 F (36.8 C) (Oral)   Ht 5\' 10"  (1.778 m)   Wt 156 lb (70.8 kg)   SpO2 96%   BMI 22.38 kg/m   Wt Readings from Last 5 Encounters:  05/28/21 156 lb (70.8 kg)  10/18/20 158 lb (71.7 kg)    BMI Readings from Last 5 Encounters:  05/28/21 22.38 kg/m  10/18/20 22.67 kg/m     Physical Exam General: Well-appearing, sitting in chair Eyes: EOMI, icterus Neck: Supple, no JVP Pulmonary: Clear, no wheeze, good excursion, good air movement Cardiovascular: Regular rhythm, no murmur MSK: No synovitis, no joint effusion Abdomen: Nondistended, bowel sounds present Neuro: Normal gait, no weakness Psych: Normal mood, flat affect   Assessment & Plan:   DOE: Suspect largely driven by smoking related disease. Prior CXR hyperinflated consistent with likely  emphysema. Anoro helping. To continue Anoro. PFTs for further evaluation. Discussed graduated exercise program. She is keen on pursuing this.  Likely anxiety contributing to severity of symptoms which is understandable as dyspnea can lead to anxiety persistent dyspnea which means to anxiety over and over etc. etc.  Asked her to discuss anxiety and depression symptoms with her PCP.  Would recommend pulmonary rehab if COPD present on PFTs.   Hyperinflation: Seen on chest x-ray.  Suspect related to emphysema.  High likelihood of COPD.  PFTs as above.  To continue Anoro.    Return in about 3 months (around 08/28/2021).   Lanier Clam, MD 05/28/2021

## 2021-08-12 ENCOUNTER — Ambulatory Visit: Payer: Medicare HMO | Admitting: Pulmonary Disease

## 2021-09-10 ENCOUNTER — Encounter: Payer: Self-pay | Admitting: Pulmonary Disease

## 2021-09-10 ENCOUNTER — Other Ambulatory Visit: Payer: Self-pay

## 2021-09-10 ENCOUNTER — Ambulatory Visit (INDEPENDENT_AMBULATORY_CARE_PROVIDER_SITE_OTHER): Payer: Medicare HMO | Admitting: Pulmonary Disease

## 2021-09-10 VITALS — BP 128/76 | HR 68 | Temp 98.0°F | Ht 70.0 in | Wt 155.0 lb

## 2021-09-10 DIAGNOSIS — J438 Other emphysema: Secondary | ICD-10-CM | POA: Diagnosis not present

## 2021-09-10 DIAGNOSIS — R0609 Other forms of dyspnea: Secondary | ICD-10-CM

## 2021-09-10 DIAGNOSIS — J449 Chronic obstructive pulmonary disease, unspecified: Secondary | ICD-10-CM | POA: Insufficient documentation

## 2021-09-10 DIAGNOSIS — Z720 Tobacco use: Secondary | ICD-10-CM

## 2021-09-10 LAB — PULMONARY FUNCTION TEST
DL/VA % pred: 66 %
DL/VA: 2.63 ml/min/mmHg/L
DLCO cor % pred: 51 %
DLCO cor: 12.13 ml/min/mmHg
DLCO unc % pred: 51 %
DLCO unc: 12.13 ml/min/mmHg
FEF 25-75 Post: 0.92 L/sec
FEF 25-75 Pre: 0.72 L/sec
FEF2575-%Change-Post: 28 %
FEF2575-%Pred-Post: 41 %
FEF2575-%Pred-Pre: 32 %
FEV1-%Change-Post: 2 %
FEV1-%Pred-Post: 49 %
FEV1-%Pred-Pre: 47 %
FEV1-Post: 1.39 L
FEV1-Pre: 1.35 L
FEV1FVC-%Change-Post: -5 %
FEV1FVC-%Pred-Pre: 78 %
FEV6-%Change-Post: 6 %
FEV6-%Pred-Post: 68 %
FEV6-%Pred-Pre: 64 %
FEV6-Post: 2.43 L
FEV6-Pre: 2.29 L
FEV6FVC-%Pred-Post: 104 %
FEV6FVC-%Pred-Pre: 104 %
FVC-%Change-Post: 9 %
FVC-%Pred-Post: 67 %
FVC-%Pred-Pre: 61 %
FVC-Post: 2.5 L
FVC-Pre: 2.29 L
Post FEV1/FVC ratio: 56 %
Post FEV6/FVC ratio: 100 %
Pre FEV1/FVC ratio: 59 %
Pre FEV6/FVC Ratio: 100 %

## 2021-09-10 MED ORDER — ANORO ELLIPTA 62.5-25 MCG/ACT IN AEPB: 1.0000 | INHALATION_SPRAY | Freq: Every day | RESPIRATORY_TRACT | 11 refills | Status: AC

## 2021-09-10 NOTE — Patient Instructions (Signed)
Full PFT performed today. °

## 2021-09-10 NOTE — Patient Instructions (Signed)
Nice to see you again  I refilled the Anoro  I recommend gradually and increasing time exercising at home  Walk around the house for 10 minutes twice a day.  Do this for 2 or 3 weeks.  Then every 2 weeks, increase by 5 minutes or so.  Ideally I would like to see you walking up to 30 minutes at a time.  I hope this will help your shortness of breath around the house with normal tasks as well.  Return to clinic in 6 months or sooner as needed with Dr. Silas Flood

## 2021-09-10 NOTE — Progress Notes (Signed)
@Patient  ID: Andrea Little, female    DOB: 1951/01/18, 71 y.o.   MRN: [id number]  Chief Complaint  Patient presents with   Follow-up    Follow up from 3 months ago. Pt did have full pft before visit. Pt states she is still have shortness of breath     Referring provider: Kelton Pillar, MD  HPI:   71 y.o. woman whom we are seeing in follow up for evaluation of dyspnea on exertion.    Returns for routine follow-up.  Dyspnea largely unchanged.  Relates a lot of MSK issues in terms of pain that limits her mobility and activity.  Not very active.  Not exercising.  We discussed graduated exercise program at last visit.  Not doing.  Anoro is somewhat helpful.  Discussed at length pulmonary rehab would likely be the most helpful thing in terms of her symptoms.  She acknowledges this but says that her barriers is not feasible right now.  Reviewed her PFTs today which show on spirometry severe fixed obstruction without significant bronchodilator response, the remainder of test is largely uninterpretable due to difficulty doing maneuvers.  HPI at initial visit: Overall, she describes dyspnea on exertion present for last 2 to 3 years.  Gradually worsening.  Used to be more active.  Now rarely leaves the house.  Does not really exert self at all.  Gets dyspneic vacuuming 1 room.  Worse on inclines or stairs.  No time of day when things are better or worse.  No position to make things better or worse.  No seasonal environmental factors she can identify to make things better or worse.  She has been prescribed Anoro.  She does feel active helps with her symptoms.  No other relieving or exacerbating factors.  She continues to smoke, over 50-pack-year smoking history.  Chest x-ray 10/2017 reviewed interpreted as clear lungs with hyperinflation.  Most recent chest imaging chest x-ray 04/2021 reviewed interpreted as clear lungs with hyperinflation, no changes compared to 2019.  She reports echocardiogram in the  recent past.  Cannot see results of this.  She states things looked okay per her understanding of the report.  She endorses ongoing anxiety symptoms.  Son is major trigger.  Prescribed venlafaxine as well as alprazolam 1 mg 3 times daily as needed.  She feels anxiety makes her symptoms worse.  PMH: Hypertension, emphysema, hyperlipidemia, seasonal allergies, anxiety Surgical history: Reviewed with patient, she denies any Family history: Emphysema in father, CAD in mother Social history: Current smoker, 1 pack a day, approximate 53-pack-year history, grew up in [place], has lived in [place] since 2008, son still lives in [place] / Pulmonary Flowsheets:   ACT:  No flowsheet data found.  MMRC: No flowsheet data found.  Epworth:  No flowsheet data found.  Tests:   FENO:  No results found for: NITRICOXIDE  PFT: PFT Results Latest Ref Rng & Units 09/10/2021  FVC-Pre L 2.29  FVC-Predicted Pre % 61  FVC-Post L 2.50  FVC-Predicted Post % 67  Pre FEV1/FVC % % 59  Post FEV1/FCV % % 56  FEV1-Pre L 1.35  FEV1-Predicted Pre % 47  FEV1-Post L 1.39  DLCO uncorrected ml/min/mmHg 12.13  DLCO UNC% % 51  DLCO corrected ml/min/mmHg 12.13  DLCO COR %Predicted % 51  DLVA Predicted % 66  Personally reviewed, some maneuvers not repeatable or interpretable, lung volumes and DLCO hard to interpret, but spirometry appears repeatable on flow-volume loops and consistent with fixed severe obstruction without significant  bronchodilator response.  WALK:  No flowsheet data found.  Imaging: Personally reviewed and as per EMR discussion this note  Lab Results:  CBC Personally reviewed results 02/2020 via care everywhere No results found for: WBC, RBC, HGB, HCT, PLT, MCV, MCH, MCHC, RDW, LYMPHSABS, MONOABS, EOSABS, BASOSABS  BMET No results found for: NA, K, CL, CO2, GLUCOSE, BUN, CREATININE, CALCIUM, GFRNONAA, GFRAA  BNP No results found for: BNP  ProBNP No results found  for: PROBNP  Specialty Problems       Pulmonary Problems   COPD (chronic obstructive pulmonary disease) ([facility])   Allergies  Allergen Reactions   Atorvastatin     Other reaction(s): leg cramps   Hydrochlorothiazide     Other reaction(s): hyponatremia   Penicillin G     Other reaction(s): Other (See Comments) As a child does not recall the reaction     Immunization History  Administered Date(s) Administered   PFIZER(Purple Top)SARS-COV-2 Vaccination 09/24/2019, 11/06/2019, 06/13/2020   Pneumococcal Conjugate-13 08/04/2016   Pneumococcal Polysaccharide-23 01/22/2020   Tdap 06/16/2013    Past Medical History:  Diagnosis Date   AAA (abdominal aortic aneurysm)    Back pain    Cancer ([facility])    skin   Chronic kidney disease    COPD (chronic obstructive pulmonary disease) ([facility])    Hyperlipidemia    Hypertension    Major depression    Mood disorder ([facility])    Tremor     Tobacco History: Social History   Tobacco Use  Smoking Status Every Day   Packs/day: 1.00   Types: Cigarettes  Smokeless Tobacco Never  Tobacco Comments   1 ppd    Ready to quit: Not Answered Counseling given: Not Answered Tobacco comments: 1 ppd     Continue to not smoke  Outpatient Encounter Medications as of 09/10/2021  Medication Sig   acetaminophen (TYLENOL) 500 MG tablet 2 tablets as needed   ALPRAZolam (XANAX) 1 MG tablet Take 1 mg by mouth 3 (three) times daily as needed.   amLODipine (NORVASC) 5 MG tablet Take 5 mg by mouth daily.   atenolol (TENORMIN) 100 MG tablet Take 100 mg by mouth daily.   benazepril (LOTENSIN) 40 MG tablet Take 40 mg by mouth daily.   Calcium Carbonate-Vit D-Min (CALCIUM 1200) 1200-1000 MG-UNIT CHEW 1 tablet   fexofenadine (ALLEGRA) 180 MG tablet Take 180 mg by mouth daily as needed.   fluticasone (FLONASE) 50 MCG/ACT nasal spray    venlafaxine XR (EFFEXOR-XR) 150 MG 24 hr capsule Take 150 mg by mouth daily. with food   [DISCONTINUED] ANORO ELLIPTA 62.5-25  MCG/INH AEPB 1 puff daily.   [DISCONTINUED] Vitamin D, Ergocalciferol, (DRISDOL) 1.25 MG (50000 UNIT) CAPS capsule Take 50,000 Units by mouth once a week.   [DISCONTINUED] Vitamin D, Ergocalciferol, (DRISDOL) 1.25 MG (50000 UNIT) CAPS capsule 1 capsule   No facility-administered encounter medications on file as of 09/10/2021.     Review of Systems  Review of Systems  No chest with exertion.  No orthopnea or PND.  Intermittent lower extremity swelling that worsens of the day, improves by morning with elevation.  Comprehensive review of systems otherwise negative. Physical Exam  BP 128/76 (BP Location: Left Arm, Patient Position: Sitting, Cuff Size: Normal)    Pulse 68    Temp 98 F (36.7 C) (Oral)    Ht 5\' 10"  (1.778 m)    Wt 155 lb (70.3 kg)    SpO2 97%    BMI 22.24 kg/m   Wt Readings from  Last 5 Encounters:  09/10/21 155 lb (70.3 kg)  05/28/21 156 lb (70.8 kg)  10/18/20 158 lb (71.7 kg)    BMI Readings from Last 5 Encounters:  09/10/21 22.24 kg/m  05/28/21 22.38 kg/m  10/18/20 22.67 kg/m     Physical Exam General: Well-appearing, sitting in chair Eyes: EOMI, icterus Neck: Supple, no JVP Pulmonary: Clear, no wheeze, good excursion, good air movement Cardiovascular: Regular rhythm, no murmur MSK: No synovitis, no joint effusion Abdomen: Nondistended, bowel sounds present Neuro: Normal gait, no weakness Psych: Normal mood, flat affect   Assessment & Plan:   DOE: Suspect largely driven by smoking related disease. Prior CXR hyperinflated consistent with likely emphysema. Anoro helping. PFT today with spirometry looks repeatable consistent with severe COPD with FEV1 47%  without significant bronchodilator response.  Unfortunately lung volumes did not appear consistent and are not able to be interpreted.  Suspect would benefit most from pulmonary rehab.  She does not think it is feasible from the time, cost, MSK pain standpoint.  Recommended gradual exercise program which she  will try to do at home.  Hyperinflation: Seen on chest x-ray.  Suspect related to emphysema.   To continue Anoro.  Tobacco use: Patient is precontemplative, not interested in quitting.  Return in about 6 months (around 03/10/2022).   Lanier Clam, MD 09/10/2021

## 2021-09-10 NOTE — Progress Notes (Signed)
Full PFT performed today. °

## 2021-09-15 DIAGNOSIS — E7800 Pure hypercholesterolemia, unspecified: Secondary | ICD-10-CM | POA: Diagnosis not present

## 2021-09-15 DIAGNOSIS — N183 Chronic kidney disease, stage 3 unspecified: Secondary | ICD-10-CM | POA: Diagnosis not present

## 2021-09-15 DIAGNOSIS — I719 Aortic aneurysm of unspecified site, without rupture: Secondary | ICD-10-CM | POA: Diagnosis not present

## 2021-09-15 DIAGNOSIS — I700 Atherosclerosis of aorta: Secondary | ICD-10-CM | POA: Diagnosis not present

## 2021-09-15 DIAGNOSIS — M5416 Radiculopathy, lumbar region: Secondary | ICD-10-CM | POA: Diagnosis not present

## 2021-09-15 DIAGNOSIS — J449 Chronic obstructive pulmonary disease, unspecified: Secondary | ICD-10-CM | POA: Diagnosis not present

## 2021-09-15 DIAGNOSIS — E559 Vitamin D deficiency, unspecified: Secondary | ICD-10-CM | POA: Diagnosis not present

## 2021-09-15 DIAGNOSIS — I129 Hypertensive chronic kidney disease with stage 1 through stage 4 chronic kidney disease, or unspecified chronic kidney disease: Secondary | ICD-10-CM | POA: Diagnosis not present

## 2021-09-29 DIAGNOSIS — M48062 Spinal stenosis, lumbar region with neurogenic claudication: Secondary | ICD-10-CM | POA: Diagnosis not present

## 2021-09-29 DIAGNOSIS — M545 Low back pain, unspecified: Secondary | ICD-10-CM | POA: Diagnosis not present

## 2021-09-29 DIAGNOSIS — M48061 Spinal stenosis, lumbar region without neurogenic claudication: Secondary | ICD-10-CM | POA: Diagnosis not present

## 2021-09-29 DIAGNOSIS — M79604 Pain in right leg: Secondary | ICD-10-CM | POA: Diagnosis not present

## 2021-10-28 DIAGNOSIS — M47816 Spondylosis without myelopathy or radiculopathy, lumbar region: Secondary | ICD-10-CM | POA: Diagnosis not present

## 2021-10-28 DIAGNOSIS — M5116 Intervertebral disc disorders with radiculopathy, lumbar region: Secondary | ICD-10-CM | POA: Diagnosis not present

## 2021-10-28 DIAGNOSIS — M48062 Spinal stenosis, lumbar region with neurogenic claudication: Secondary | ICD-10-CM | POA: Diagnosis not present

## 2021-10-28 DIAGNOSIS — Z79891 Long term (current) use of opiate analgesic: Secondary | ICD-10-CM | POA: Diagnosis not present

## 2021-10-28 DIAGNOSIS — M79604 Pain in right leg: Secondary | ICD-10-CM | POA: Diagnosis not present

## 2021-10-28 DIAGNOSIS — I10 Essential (primary) hypertension: Secondary | ICD-10-CM | POA: Diagnosis not present

## 2021-11-25 DIAGNOSIS — H5213 Myopia, bilateral: Secondary | ICD-10-CM | POA: Diagnosis not present

## 2021-11-25 DIAGNOSIS — H2513 Age-related nuclear cataract, bilateral: Secondary | ICD-10-CM | POA: Diagnosis not present

## 2021-11-25 DIAGNOSIS — H52201 Unspecified astigmatism, right eye: Secondary | ICD-10-CM | POA: Diagnosis not present

## 2021-11-25 DIAGNOSIS — H25013 Cortical age-related cataract, bilateral: Secondary | ICD-10-CM | POA: Diagnosis not present

## 2021-11-25 DIAGNOSIS — H524 Presbyopia: Secondary | ICD-10-CM | POA: Diagnosis not present

## 2021-11-28 DIAGNOSIS — M48061 Spinal stenosis, lumbar region without neurogenic claudication: Secondary | ICD-10-CM | POA: Diagnosis not present

## 2021-11-28 DIAGNOSIS — M79604 Pain in right leg: Secondary | ICD-10-CM | POA: Diagnosis not present

## 2021-11-28 DIAGNOSIS — M5136 Other intervertebral disc degeneration, lumbar region: Secondary | ICD-10-CM | POA: Diagnosis not present

## 2021-11-28 DIAGNOSIS — M47816 Spondylosis without myelopathy or radiculopathy, lumbar region: Secondary | ICD-10-CM | POA: Diagnosis not present

## 2021-11-28 DIAGNOSIS — M48062 Spinal stenosis, lumbar region with neurogenic claudication: Secondary | ICD-10-CM | POA: Diagnosis not present

## 2021-11-28 DIAGNOSIS — M545 Low back pain, unspecified: Secondary | ICD-10-CM | POA: Diagnosis not present

## 2021-12-07 DIAGNOSIS — M5127 Other intervertebral disc displacement, lumbosacral region: Secondary | ICD-10-CM | POA: Diagnosis not present

## 2021-12-07 DIAGNOSIS — M79604 Pain in right leg: Secondary | ICD-10-CM | POA: Diagnosis not present

## 2021-12-07 DIAGNOSIS — M545 Low back pain, unspecified: Secondary | ICD-10-CM | POA: Diagnosis not present

## 2021-12-07 DIAGNOSIS — M48061 Spinal stenosis, lumbar region without neurogenic claudication: Secondary | ICD-10-CM | POA: Diagnosis not present

## 2021-12-30 DIAGNOSIS — M48061 Spinal stenosis, lumbar region without neurogenic claudication: Secondary | ICD-10-CM | POA: Diagnosis not present

## 2021-12-30 DIAGNOSIS — I714 Abdominal aortic aneurysm, without rupture, unspecified: Secondary | ICD-10-CM | POA: Diagnosis not present

## 2021-12-30 DIAGNOSIS — M5136 Other intervertebral disc degeneration, lumbar region: Secondary | ICD-10-CM | POA: Diagnosis not present

## 2021-12-30 DIAGNOSIS — M533 Sacrococcygeal disorders, not elsewhere classified: Secondary | ICD-10-CM | POA: Diagnosis not present

## 2021-12-30 DIAGNOSIS — M47816 Spondylosis without myelopathy or radiculopathy, lumbar region: Secondary | ICD-10-CM | POA: Diagnosis not present

## 2022-01-28 DIAGNOSIS — M461 Sacroiliitis, not elsewhere classified: Secondary | ICD-10-CM | POA: Diagnosis not present

## 2022-01-28 DIAGNOSIS — R69 Illness, unspecified: Secondary | ICD-10-CM | POA: Diagnosis not present

## 2022-01-28 DIAGNOSIS — Z79899 Other long term (current) drug therapy: Secondary | ICD-10-CM | POA: Diagnosis not present

## 2022-01-28 DIAGNOSIS — M5116 Intervertebral disc disorders with radiculopathy, lumbar region: Secondary | ICD-10-CM | POA: Diagnosis not present

## 2022-01-28 DIAGNOSIS — M47816 Spondylosis without myelopathy or radiculopathy, lumbar region: Secondary | ICD-10-CM | POA: Diagnosis not present

## 2022-01-28 DIAGNOSIS — I10 Essential (primary) hypertension: Secondary | ICD-10-CM | POA: Diagnosis not present

## 2022-02-09 DIAGNOSIS — Z7189 Other specified counseling: Secondary | ICD-10-CM | POA: Diagnosis not present

## 2022-02-09 DIAGNOSIS — M8588 Other specified disorders of bone density and structure, other site: Secondary | ICD-10-CM | POA: Diagnosis not present

## 2022-02-12 ENCOUNTER — Telehealth: Payer: Self-pay

## 2022-02-12 NOTE — Telephone Encounter (Signed)
Pt called stating that she was seen in February of 2021 for an aneurysm and since it was too small for an intervention, she was told to f/u in 2 yrs. She stated that she had another MRI on 12/07/21 at [facility] and it showed the aneursym is bigger. She wanted to know if she needed to make an appt or wait until her 2 yr f/u.  Reviewed pt's chart, returned pt's call for clarification, two identifiers used. Informed pt that there was no MRI images in chart. Asked pt to call her orthopedic surgeon and fax over the MRI report to compare with previous scan to see if appt was necessary. Office fax # given. Confirmed understanding.

## 2022-02-13 DIAGNOSIS — M8580 Other specified disorders of bone density and structure, unspecified site: Secondary | ICD-10-CM | POA: Diagnosis not present

## 2022-02-13 DIAGNOSIS — Z780 Asymptomatic menopausal state: Secondary | ICD-10-CM | POA: Diagnosis not present

## 2022-02-13 DIAGNOSIS — M8589 Other specified disorders of bone density and structure, multiple sites: Secondary | ICD-10-CM | POA: Diagnosis not present

## 2022-02-16 ENCOUNTER — Telehealth: Payer: Self-pay

## 2022-02-16 NOTE — Telephone Encounter (Signed)
Pt called to ensure faxed MRI was received.  Reviewed pt's chart, returned pt's call, two identifiers used. Informed her that we had received that fax and it would be reviewed by Dr Donzetta Matters on Wednesday, 7/19. Confirmed understanding.

## 2022-02-16 NOTE — Telephone Encounter (Signed)
Received faxed MRI report. Forwarding to Dr Donzetta Matters to review.

## 2022-02-21 DIAGNOSIS — M533 Sacrococcygeal disorders, not elsewhere classified: Secondary | ICD-10-CM | POA: Diagnosis not present

## 2022-02-21 DIAGNOSIS — M47816 Spondylosis without myelopathy or radiculopathy, lumbar region: Secondary | ICD-10-CM | POA: Diagnosis not present

## 2022-02-21 DIAGNOSIS — R600 Localized edema: Secondary | ICD-10-CM | POA: Diagnosis not present

## 2022-02-21 DIAGNOSIS — M160 Bilateral primary osteoarthritis of hip: Secondary | ICD-10-CM | POA: Diagnosis not present

## 2022-02-21 DIAGNOSIS — M25551 Pain in right hip: Secondary | ICD-10-CM | POA: Diagnosis not present

## 2022-03-09 DIAGNOSIS — M5136 Other intervertebral disc degeneration, lumbar region: Secondary | ICD-10-CM | POA: Diagnosis not present

## 2022-03-09 DIAGNOSIS — M533 Sacrococcygeal disorders, not elsewhere classified: Secondary | ICD-10-CM | POA: Diagnosis not present

## 2022-03-09 DIAGNOSIS — M47816 Spondylosis without myelopathy or radiculopathy, lumbar region: Secondary | ICD-10-CM | POA: Diagnosis not present

## 2022-03-27 DIAGNOSIS — Z0000 Encounter for general adult medical examination without abnormal findings: Secondary | ICD-10-CM | POA: Diagnosis not present

## 2022-03-27 DIAGNOSIS — E559 Vitamin D deficiency, unspecified: Secondary | ICD-10-CM | POA: Diagnosis not present

## 2022-03-27 DIAGNOSIS — R69 Illness, unspecified: Secondary | ICD-10-CM | POA: Diagnosis not present

## 2022-03-27 DIAGNOSIS — J449 Chronic obstructive pulmonary disease, unspecified: Secondary | ICD-10-CM | POA: Diagnosis not present

## 2022-03-27 DIAGNOSIS — E7800 Pure hypercholesterolemia, unspecified: Secondary | ICD-10-CM | POA: Diagnosis not present

## 2022-03-27 DIAGNOSIS — Z122 Encounter for screening for malignant neoplasm of respiratory organs: Secondary | ICD-10-CM | POA: Diagnosis not present

## 2022-03-27 DIAGNOSIS — N183 Chronic kidney disease, stage 3 unspecified: Secondary | ICD-10-CM | POA: Diagnosis not present

## 2022-03-27 DIAGNOSIS — I700 Atherosclerosis of aorta: Secondary | ICD-10-CM | POA: Diagnosis not present

## 2022-03-27 DIAGNOSIS — R296 Repeated falls: Secondary | ICD-10-CM | POA: Diagnosis not present

## 2022-03-27 DIAGNOSIS — I719 Aortic aneurysm of unspecified site, without rupture: Secondary | ICD-10-CM | POA: Diagnosis not present

## 2022-03-27 DIAGNOSIS — M5416 Radiculopathy, lumbar region: Secondary | ICD-10-CM | POA: Diagnosis not present

## 2022-03-27 DIAGNOSIS — I129 Hypertensive chronic kidney disease with stage 1 through stage 4 chronic kidney disease, or unspecified chronic kidney disease: Secondary | ICD-10-CM | POA: Diagnosis not present

## 2022-03-31 DIAGNOSIS — M5441 Lumbago with sciatica, right side: Secondary | ICD-10-CM | POA: Diagnosis not present

## 2022-03-31 DIAGNOSIS — M47816 Spondylosis without myelopathy or radiculopathy, lumbar region: Secondary | ICD-10-CM | POA: Diagnosis not present

## 2022-03-31 DIAGNOSIS — M5136 Other intervertebral disc degeneration, lumbar region: Secondary | ICD-10-CM | POA: Diagnosis not present

## 2022-04-02 DIAGNOSIS — I700 Atherosclerosis of aorta: Secondary | ICD-10-CM | POA: Diagnosis not present

## 2022-04-02 DIAGNOSIS — I719 Aortic aneurysm of unspecified site, without rupture: Secondary | ICD-10-CM | POA: Diagnosis not present

## 2022-04-02 DIAGNOSIS — E559 Vitamin D deficiency, unspecified: Secondary | ICD-10-CM | POA: Diagnosis not present

## 2022-04-02 DIAGNOSIS — E7800 Pure hypercholesterolemia, unspecified: Secondary | ICD-10-CM | POA: Diagnosis not present

## 2022-04-02 DIAGNOSIS — N183 Chronic kidney disease, stage 3 unspecified: Secondary | ICD-10-CM | POA: Diagnosis not present

## 2022-04-02 DIAGNOSIS — M8588 Other specified disorders of bone density and structure, other site: Secondary | ICD-10-CM | POA: Diagnosis not present

## 2022-04-02 DIAGNOSIS — I129 Hypertensive chronic kidney disease with stage 1 through stage 4 chronic kidney disease, or unspecified chronic kidney disease: Secondary | ICD-10-CM | POA: Diagnosis not present

## 2022-04-08 ENCOUNTER — Other Ambulatory Visit: Payer: Self-pay | Admitting: Internal Medicine

## 2022-04-08 DIAGNOSIS — Z720 Tobacco use: Secondary | ICD-10-CM

## 2022-04-08 DIAGNOSIS — Z122 Encounter for screening for malignant neoplasm of respiratory organs: Secondary | ICD-10-CM

## 2022-04-23 ENCOUNTER — Ambulatory Visit
Admission: RE | Admit: 2022-04-23 | Discharge: 2022-04-23 | Disposition: A | Discharge status: Home / Self Care | Payer: Medicare HMO | Source: Ambulatory Visit | Attending: Internal Medicine | Admitting: Internal Medicine

## 2022-04-23 DIAGNOSIS — R69 Illness, unspecified: Secondary | ICD-10-CM | POA: Diagnosis not present

## 2022-04-23 DIAGNOSIS — D3500 Benign neoplasm of unspecified adrenal gland: Secondary | ICD-10-CM | POA: Diagnosis not present

## 2022-04-23 DIAGNOSIS — Z122 Encounter for screening for malignant neoplasm of respiratory organs: Secondary | ICD-10-CM

## 2022-04-23 DIAGNOSIS — I714 Abdominal aortic aneurysm, without rupture, unspecified: Secondary | ICD-10-CM | POA: Diagnosis not present

## 2022-04-23 DIAGNOSIS — Z720 Tobacco use: Secondary | ICD-10-CM

## 2022-04-23 DIAGNOSIS — E278 Other specified disorders of adrenal gland: Secondary | ICD-10-CM | POA: Diagnosis not present

## 2022-04-23 DIAGNOSIS — F17210 Nicotine dependence, cigarettes, uncomplicated: Secondary | ICD-10-CM | POA: Diagnosis not present

## 2022-04-28 ENCOUNTER — Telehealth: Payer: Self-pay

## 2022-04-28 NOTE — Telephone Encounter (Signed)
-----   Message from [name], MD sent at 04/28/2022 11:44 AM EDT ----- Regarding: RE: CT Results Ok to wait until scheduled folllow up visit.   bc ----- Message ----- From: Gevena Mart, RN Sent: 04/28/2022  11:04 AM EDT To: [name], MD Subject: CT Results                                     Dr. Donzetta Matters, CT results show no lung ca (yay), but shows an increase in AAA from 3.5 cm to 4.1 cm. Pt is to return in Feb/March for surveillance.   Impression #2 The S modifier above refers to a partially visualized infrarenal abdominal aortic aneurysm which measures at least 4.1 cm. As this is only partially visualized recommend further investigation with dedicated ultrasound of the abdominal aorta. If the maximum diameter remains 4.1 cm then follow-up every 12 months and vascular consultation is advised.  Do you want pt to have [place] now (due to above recommendation) or wait until next visit? Thanks, Engineer, materials - Triage

## 2022-04-28 NOTE — Telephone Encounter (Signed)
Andrea Cloud RN with Andrea [facility] at [facility] called with a report concerning this pt.  Reviewed pt's chart, returned call for clarification, two identifiers used. She stated that Dr Doristine Bosworth wanted to ensure that Dr Donzetta Matters was informed about pt's CT results and increase of AAA from 3.5 cm to 4.1 cm. Informed her that Dr Donzetta Matters would be informed and see if pt needed to come back into the office for an [place] or an earlier visit than February. Confirmed understanding.

## 2022-05-14 DIAGNOSIS — M25551 Pain in right hip: Secondary | ICD-10-CM | POA: Diagnosis not present

## 2022-05-14 DIAGNOSIS — M5136 Other intervertebral disc degeneration, lumbar region: Secondary | ICD-10-CM | POA: Diagnosis not present

## 2022-05-14 DIAGNOSIS — M47816 Spondylosis without myelopathy or radiculopathy, lumbar region: Secondary | ICD-10-CM | POA: Diagnosis not present

## 2022-06-10 DIAGNOSIS — I714 Abdominal aortic aneurysm, without rupture, unspecified: Secondary | ICD-10-CM | POA: Diagnosis not present

## 2022-06-10 DIAGNOSIS — R1030 Lower abdominal pain, unspecified: Secondary | ICD-10-CM | POA: Diagnosis not present

## 2022-06-10 DIAGNOSIS — R69 Illness, unspecified: Secondary | ICD-10-CM | POA: Diagnosis not present

## 2022-06-10 DIAGNOSIS — K5909 Other constipation: Secondary | ICD-10-CM | POA: Diagnosis not present

## 2022-06-12 ENCOUNTER — Emergency Department (HOSPITAL_BASED_OUTPATIENT_CLINIC_OR_DEPARTMENT_OTHER)
Admission: EM | Admit: 2022-06-12 | Discharge: 2022-06-12 | Disposition: A | Discharge status: Home / Self Care | Payer: Medicare HMO | Attending: Emergency Medicine | Admitting: Emergency Medicine

## 2022-06-12 ENCOUNTER — Other Ambulatory Visit: Payer: Self-pay

## 2022-06-12 ENCOUNTER — Encounter (HOSPITAL_BASED_OUTPATIENT_CLINIC_OR_DEPARTMENT_OTHER): Payer: Self-pay | Admitting: Emergency Medicine

## 2022-06-12 DIAGNOSIS — R109 Unspecified abdominal pain: Secondary | ICD-10-CM | POA: Diagnosis not present

## 2022-06-12 DIAGNOSIS — E871 Hypo-osmolality and hyponatremia: Secondary | ICD-10-CM | POA: Diagnosis not present

## 2022-06-12 DIAGNOSIS — I700 Atherosclerosis of aorta: Secondary | ICD-10-CM | POA: Diagnosis not present

## 2022-06-12 DIAGNOSIS — Z79899 Other long term (current) drug therapy: Secondary | ICD-10-CM | POA: Insufficient documentation

## 2022-06-12 DIAGNOSIS — K5730 Diverticulosis of large intestine without perforation or abscess without bleeding: Secondary | ICD-10-CM | POA: Diagnosis not present

## 2022-06-12 DIAGNOSIS — I714 Abdominal aortic aneurysm, without rupture, unspecified: Secondary | ICD-10-CM | POA: Insufficient documentation

## 2022-06-12 DIAGNOSIS — K7689 Other specified diseases of liver: Secondary | ICD-10-CM | POA: Diagnosis not present

## 2022-06-12 DIAGNOSIS — E278 Other specified disorders of adrenal gland: Secondary | ICD-10-CM | POA: Diagnosis not present

## 2022-06-12 LAB — COMPREHENSIVE METABOLIC PANEL
ALT: 11 U/L (ref 0–44)
AST: 14 U/L — ABNORMAL LOW (ref 15–41)
Albumin: 4.1 g/dL (ref 3.5–5.0)
Alkaline Phosphatase: 125 U/L (ref 38–126)
Anion gap: 8 (ref 5–15)
BUN: 10 mg/dL (ref 8–23)
CO2: 24 mmol/L (ref 22–32)
Calcium: 9.1 mg/dL (ref 8.9–10.3)
Chloride: 93 mmol/L — ABNORMAL LOW (ref 98–111)
Creatinine, Ser: 1.01 mg/dL — ABNORMAL HIGH (ref 0.44–1.00)
GFR, Estimated: 59 mL/min — ABNORMAL LOW (ref >60)
Glucose, Bld: 128 mg/dL — ABNORMAL HIGH (ref 70–99)
Potassium: 3.9 mmol/L (ref 3.5–5.1)
Sodium: 125 mmol/L — ABNORMAL LOW (ref 135–145)
Total Bilirubin: 0.5 mg/dL (ref 0.3–1.2)
Total Protein: 7.6 g/dL (ref 6.5–8.1)

## 2022-06-12 LAB — CBC WITH DIFFERENTIAL/PLATELET
Abs Immature Granulocytes: 0.06 10*3/uL (ref 0.00–0.07)
Basophils Absolute: 0.1 10*3/uL (ref 0.0–0.1)
Basophils Relative: 1 %
Eosinophils Absolute: 0.1 10*3/uL (ref 0.0–0.5)
Eosinophils Relative: 1 %
HCT: 44.4 % (ref 36.0–46.0)
Hemoglobin: 14.7 g/dL (ref 12.0–15.0)
Immature Granulocytes: 1 %
Lymphocytes Relative: 20 %
Lymphs Abs: 1.9 10*3/uL (ref 0.7–4.0)
MCH: 29.4 pg (ref 26.0–34.0)
MCHC: 33.1 g/dL (ref 30.0–36.0)
MCV: 88.8 fL (ref 80.0–100.0)
Monocytes Absolute: 0.8 10*3/uL (ref 0.1–1.0)
Monocytes Relative: 9 %
Neutro Abs: 6.4 10*3/uL (ref 1.7–7.7)
Neutrophils Relative %: 68 %
Platelets: 282 10*3/uL (ref 150–400)
RBC: 5 MIL/uL (ref 3.87–5.11)
RDW: 12.7 % (ref 11.5–15.5)
WBC: 9.3 10*3/uL (ref 4.0–10.5)
nRBC: 0 % (ref 0.0–0.2)

## 2022-06-12 LAB — PROTIME-INR
INR: 0.9 (ref 0.8–1.2)
Prothrombin Time: 12.5 seconds (ref 11.4–15.2)

## 2022-06-12 LAB — LIPASE, BLOOD: Lipase: 36 U/L (ref 11–51)

## 2022-06-12 MED ORDER — SODIUM CHLORIDE 0.9 % IV BOLUS (SEPSIS)
1000.0000 mL | Freq: Once | INTRAVENOUS | Status: AC
  Administered 2022-06-12: 1000 mL via INTRAVENOUS

## 2022-06-12 NOTE — ED Notes (Signed)
Lt 153/65, rt 157/74

## 2022-06-12 NOTE — ED Triage Notes (Signed)
Pt was told her AAA had increased from 3.5 cm to 4.1 cm on a CT scan that was performed today. Pt was told to come be evaluated in the ED. Pt endorses abdominal pain for years.

## 2022-06-12 NOTE — Discharge Instructions (Signed)
Today you were seen in the emergency department for your abdominal aortic aneurysm on CT.    In the emergency department we talked to the vascular surgeons who feel that this is likely slow progression of your disease and is not life-threatening at this time.    At home, please occasionally drink sodium containing drinks such as Gatorade or Powerade for your hyponatremia (low sodium).    Check your MyChart online for the results of any tests that had not resulted by the time you left the emergency department.   Follow-up with your primary doctor in 2-3 days regarding your visit and low sodium.  Follow-up with your vascular surgeon as soon as possible.  Return immediately to the emergency department if you experience any of the following: Worsening abdominal pain, fainting, or any other concerning symptoms.    Thank you for visiting our Emergency Department. It was a pleasure taking care of you today.

## 2022-06-12 NOTE — ED Provider Notes (Signed)
[facility] EMERGENCY DEPARTMENT Provider Note   CSN: [id number] Arrival date & time: 06/12/22  1549     History {Add pertinent medical, surgical, social history, OB history to HPI:1} No chief complaint on file.   Andrea Little is a 71 y.o. female.  71 year old female with a history of AAA who presents emergency department as a referral from radiology for 4.3 cm AAA seen on outside imaging today with concerning findings.  Per the patient she has had abdominal pain for the past 2 years after being diagnosed with an abdominal aortic aneurysm.  Says that she had a CT scan today because she had an elevated lipase and nausea and vomiting recently that showed a 4.3 cm abdominal aortic aneurysm with concerning features.  Says that her pain is currently a 3/10 in severity and is no worse than it typically is over the past 2 years.  Denies any nausea or vomiting today.  Says that she would not have come to the emergency department if she is not told to do so by radiology.  Denies any blood thinners or syncope recently but does take aspirin.         Home Medications Prior to Admission medications   Medication Sig Start Date End Date Taking? Authorizing Provider  acetaminophen (TYLENOL) 500 MG tablet 2 tablets as needed    [provider]  ALPRAZolam (XANAX) 1 MG tablet Take 1 mg by mouth 3 (three) times daily as needed. 09/26/20   [provider]  amLODipine (NORVASC) 5 MG tablet Take 5 mg by mouth daily. 09/11/20   [provider]  atenolol (TENORMIN) 100 MG tablet Take 100 mg by mouth daily. 10/17/20   [provider]  benazepril (LOTENSIN) 40 MG tablet Take 40 mg by mouth daily. 09/12/20   [provider]  Calcium Carbonate-Vit D-Min (CALCIUM 1200) 1200-1000 MG-UNIT CHEW 1 tablet    [provider]  fexofenadine (ALLEGRA) 180 MG tablet Take 180 mg by mouth daily as needed. 05/10/21   [provider]  fluticasone Asencion Islam) 50  MCG/ACT nasal spray  05/10/21   [provider]  umeclidinium-vilanterol (ANORO ELLIPTA) 62.5-25 MCG/ACT AEPB Inhale 1 puff into the lungs daily. 09/10/21   Hunsucker, Bonna Gains, MD  venlafaxine XR (EFFEXOR-XR) 150 MG 24 hr capsule Take 150 mg by mouth daily. with food 08/03/20   [provider]      Allergies    Atorvastatin, Hydrochlorothiazide, and Penicillin g    Review of Systems   Review of Systems  Physical Exam Updated Vital Signs BP (!) 157/91 (BP Location: Right Arm)   Pulse 63   Temp (!) 96.7 F (35.9 C)   Resp 15   Ht '5\' 10"'$  (1.778 m)   Wt 69.9 kg   SpO2 97%   BMI 22.10 kg/m  Physical Exam Vitals and nursing note reviewed.  Constitutional:      General: She is not in acute distress.    Appearance: She is well-developed.  HENT:     Head: Normocephalic and atraumatic.     Right Ear: External ear normal.     Left Ear: External ear normal.     Nose: Nose normal.  Eyes:     Extraocular Movements: Extraocular movements intact.     Conjunctiva/sclera: Conjunctivae normal.     Pupils: Pupils are equal, round, and reactive to light.  Cardiovascular:     Rate and Rhythm: Normal rate and regular rhythm.     Heart sounds: No murmur  heard. Pulmonary:     Effort: Pulmonary effort is normal. No respiratory distress.  Abdominal:     General: Abdomen is flat. There is no distension.     Palpations: Abdomen is soft.     Tenderness: There is no abdominal tenderness. There is no guarding.     Comments: Pulsatile mass in the abdomen with deep palpation.  Nontender.  Musculoskeletal:        General: No swelling.     Cervical back: Normal range of motion and neck supple.     Right lower leg: No edema.     Left lower leg: No edema.  Skin:    General: Skin is warm and dry.     Capillary Refill: Capillary refill takes less than 2 seconds.  Neurological:     Mental Status: She is alert and oriented to person, place, and time. Mental status is at baseline.   Psychiatric:        Mood and Affect: Mood normal.     ED Results / Procedures / Treatments   Labs (all labs ordered are listed, but only abnormal results are displayed) Labs Reviewed - No data to display  EKG None  Radiology No results found.  Procedures Procedures   EMERGENCY DEPARTMENT [place] FAST EXAM "Limited Ultrasound of the Abdomen and Pericardium" (FAST Exam).   INDICATIONS: AAA on CT scan Multiple views of the abdomen and pericardium are obtained with a multi-frequency probe.  PERFORMED BY: Myself IMAGES ARCHIVED?: No LIMITATIONS:  None INTERPRETATION:  No abdominal free fluid   EMERGENCY DEPARTMENT ULTRASOUND  Study: Limited Retroperitoneal Ultrasound of the Abdominal Aorta.  INDICATIONS: AAA on CT Multiple views of the abdominal aorta were obtained in real-time from the diaphragmatic hiatus to the aortic bifurcation in transverse planes with a multi-frequency probe.  PERFORMED BY: Myself IMAGES ARCHIVED?: No LIMITATIONS:  None INTERPRETATION:  Abdominal aortic aneurysm present - diameter dimensions 4.2 cm in maximal dimension. No signs of rupture.    Medications Ordered in ED Medications - No data to display  ED Course/ Medical Decision Making/ A&P Clinical Course as of 06/12/22 1636  Fri Jun 12, 2022  1624 Per Dr Carlis Abbott who has reviewed the imaging he feels that this is chronic.  He is not concerned about rupture given the fact that there is no extravasation.  He will have her follow-up in clinic. [RP]    Clinical Course User Index [RP] Fransico Meadow, MD                           Medical Decision Making Amount and/or Complexity of Data Reviewed Labs: ordered.   Andrea Little is a 71 y.o. female with comorbidities that complicate the patient evaluation including AAA who presents with chief complaint of referral for concerning CT findings.  This patient presents to the ED for concern of complaints listed in HPI, this involves an extensive  number of treatment options, and is a complaint that carries with it a high risk of complications and morbidity. Disposition including potential need for admission considered.   Initial Ddx:  Abdominal aortic aneurysm, pancreatitis, gastroenteritis  MDM:  Initially was concerned about impending rupture of abdominal aortic aneurysm so will consult GI.  Elevated lipase at OSH would also be concerning for possible pancreatitis but appears the patient is not having significant symptoms at this time.  Plan:  ***  ED Summary/Re-evaluation:  ***  Dispo: {Disposition:28069}   Additional history obtained from {Additional  History:28067} Records reviewed {Records Reviewed:28068} The following labs were independently interpreted: {labs interpreted:28064} and show {lab findings:28250} I independently reviewed the following imaging with scope of interpretation limited to determining acute life threatening conditions related to emergency care: {imaging interpreted:28065}, which revealed {No acute abnormality:28066}  I personally reviewed and interpreted cardiac monitoring: {cardiac monitoring:28251} I personally reviewed and interpreted the pt's EKG: see above for interpretation  I have reviewed the patients home medications and made adjustments as needed Consults: {Consultants:28063} Social Determinants of health:  ***  {Document critical care time when appropriate:1} {Document POCUS if Performed:1}   {Document critical care time when appropriate:1} {Document review of labs and clinical decision tools ie heart score, Chads2Vasc2 etc:1}  {Document your independent review of radiology images, and any outside records:1} {Document your discussion with family members, caretakers, and with consultants:1} {Document social determinants of health affecting pt's care:1} {Document your decision making why or why not admission, treatments were needed:1} Final Clinical Impression(s) / ED Diagnoses Final  diagnoses:  None    Rx / DC Orders ED Discharge Orders     None

## 2022-06-12 NOTE — Progress Notes (Addendum)
71 year old female that I was contacted from [facility] with a 4.3 cm abdominal aortic aneurysm on CT.  Outpatient CT for elevated lipase.  The radiologist read prominent fat stranding with concern for impending rupture.  According to the ED she has no abdominal pain when they palpate her aneurysm.  She has chronic pain at baseline worse with eating that is not consistent with aneurysm pain.  I reviewed her CT scan that shows no evidence of rupture.  She can maintain her follow-up with Dr. Donzetta Matters in our practice.  I will send the office a note for follow-up since she was last seen several years ago and it measured 3.4 cm.  Marty Heck, MD [facility] Office: [place]

## 2022-06-12 NOTE — ED Notes (Signed)
EDP at bedside, performing ultrasound

## 2022-06-12 NOTE — ED Notes (Signed)
ED MD informed of pt Na level of 125, awaiting orders

## 2022-06-16 DIAGNOSIS — M25551 Pain in right hip: Secondary | ICD-10-CM | POA: Diagnosis not present

## 2022-06-16 DIAGNOSIS — M5136 Other intervertebral disc degeneration, lumbar region: Secondary | ICD-10-CM | POA: Diagnosis not present

## 2022-06-16 DIAGNOSIS — M47816 Spondylosis without myelopathy or radiculopathy, lumbar region: Secondary | ICD-10-CM | POA: Diagnosis not present

## 2022-06-16 DIAGNOSIS — M7061 Trochanteric bursitis, right hip: Secondary | ICD-10-CM | POA: Diagnosis not present

## 2022-06-24 ENCOUNTER — Encounter: Payer: Self-pay | Admitting: Vascular Surgery

## 2022-06-24 ENCOUNTER — Ambulatory Visit: Payer: Medicare HMO | Admitting: Vascular Surgery

## 2022-06-24 VITALS — BP 179/81 | HR 66 | Temp 98.4°F | Resp 18 | Ht 70.0 in | Wt 158.4 lb

## 2022-06-24 DIAGNOSIS — I7142 Juxtarenal abdominal aortic aneurysm, without rupture: Secondary | ICD-10-CM | POA: Diagnosis not present

## 2022-06-24 NOTE — Progress Notes (Signed)
Patient ID: Andrea Little, female   DOB: [birth year], 71 y.o.   MRN: [id number]  Reason for Consult: New Patient (Initial Visit) and Follow-up ([place] seen in ED 06/12/22 - c/o persistent HA on R side /)   Referred by Andrea Jewel, MD  Subjective:     HPI:  Andrea Little is a 71 y.o. female with known history of abdominal aortic aneurysm with hyperlipidemia, hypertension, COPD and chronic kidney disease.  She states that she has had chronic anterior abdominal pain for about 1 year and also has a long history of back pain.  She did not previously know about any aneurysm disease control this was discovered about the time that I saw her in 2022.  She states that she is very weak that her abdominal pain and back pain prevent her from too much activity.  She states that she also has chronic kidney disease.  Recent CT scan performed for the abdominal pain.  She continues to smoke daily.  Past Medical History:  Diagnosis Date   AAA (abdominal aortic aneurysm) (HCC)    Back pain    Cancer (HCC)    skin   Chronic kidney disease    COPD (chronic obstructive pulmonary disease) (HCC)    Hyperlipidemia    Hypertension    Major depression    Mood disorder (HCC)    Tremor    History reviewed. No pertinent family history. History reviewed. No pertinent surgical history.  Short Social History:  Social History   Tobacco Use   Smoking status: Every Day    Packs/day: 1.00    Types: Cigarettes   Smokeless tobacco: Never   Tobacco comments:    1 ppd   Substance Use Topics   Alcohol use: Not Currently    Allergies  Allergen Reactions   Atorvastatin     Other reaction(s): leg cramps   Hydrochlorothiazide     Other reaction(s): hyponatremia   Penicillin G     Other reaction(s): Other (See Comments) As a child does not recall the reaction     Current Outpatient Medications  Medication Sig Dispense Refill   acetaminophen (TYLENOL) 500 MG tablet 2 tablets as needed     ALPRAZolam (XANAX)  1 MG tablet Take 1 mg by mouth 3 (three) times daily as needed.     amLODipine (NORVASC) 5 MG tablet Take 5 mg by mouth daily.     atenolol (TENORMIN) 100 MG tablet Take 100 mg by mouth daily.     benazepril (LOTENSIN) 40 MG tablet Take 40 mg by mouth daily.     Calcium Carbonate-Vit D-Min (CALCIUM 1200) 1200-1000 MG-UNIT CHEW 1 tablet     fexofenadine (ALLEGRA) 180 MG tablet Take 180 mg by mouth daily as needed.     fluticasone (FLONASE) 50 MCG/ACT nasal spray      traMADol (ULTRAM) 50 MG tablet Take 50 mg by mouth every 6 (six) hours as needed.     umeclidinium-vilanterol (ANORO ELLIPTA) 62.5-25 MCG/ACT AEPB Inhale 1 puff into the lungs daily. 60 each 11   venlafaxine XR (EFFEXOR-XR) 150 MG 24 hr capsule Take 150 mg by mouth daily. with food     No current facility-administered medications for this visit.    Review of Systems  Constitutional: Positive for fatigue.  HENT: HENT negative.  Eyes: Eyes negative.  Respiratory: Respiratory negative.  Cardiovascular: Cardiovascular negative.  GI: Positive for abdominal pain.  Musculoskeletal: Positive for back pain.  Skin: Skin negative.  Neurological: Positive for focal weakness.  Hematologic: Hematologic/lymphatic negative.  Psychiatric: Psychiatric negative.        Objective:  Objective   Vitals:   06/24/22 1352  BP: (!) 179/81  Pulse: 66  Resp: 18  Temp: 98.4 F (36.9 C)  TempSrc: Temporal  SpO2: 95%  Weight: 158 lb 6.4 oz (71.8 kg)  Height: '5\' 10"'$  (1.778 m)   Body mass index is 22.73 kg/m.  Physical Exam HENT:     Head: Normocephalic.     Nose: Nose normal.  Eyes:     Pupils: Pupils are equal, round, and reactive to light.  Cardiovascular:     Rate and Rhythm: Normal rate.     Pulses:          Femoral pulses are 1+ on the right side and 1+ on the left side.      Popliteal pulses are 0 on the right side and 0 on the left side.       Dorsalis pedis pulses are 0 on the right side and 0 on the left side.        Posterior tibial pulses are 0 on the right side and 0 on the left side.  Pulmonary:     Effort: Pulmonary effort is normal.  Abdominal:     General: Abdomen is flat.     Palpations: Abdomen is soft. There is no mass.  Musculoskeletal:     Right lower leg: No edema.     Left lower leg: No edema.  Skin:    General: Skin is warm.     Capillary Refill: Capillary refill takes less than 2 seconds.  Neurological:     Mental Status: She is alert.  Psychiatric:        Mood and Affect: Mood normal.        Behavior: Behavior normal.        Thought Content: Thought content normal.        Judgment: Judgment normal.     Data: CT IMPRESSION: 1. 4.3 cm abdominal aortic aneurysm, extending from the level of the renal arteries through the aortic bifurcation. Prominent fat stranding in the para-aortic tissues at the superior extent of the aneurysm highly concerning for leak or impending rupture. No evidence of extraluminal contrast extravasation at this time. Emergent vascular surgery consultation is recommended. 2. Distal colonic diverticulosis without diverticulitis. 3. Bilateral adrenal nodularity, probable benign adenoma. Recommend follow-up adrenal washout CT in 1 year. If stable for = 1 year, no further follow-up imaging. JACR 2017 Aug; 14(8):1038-44, JCAT 2016 Mar-Apr; 40(2):194-200, Urol J 2006 Spring; 3(2):71-4. 4. Aortic Atherosclerosis (ICD10-I70.0). 5. Hepatic steatosis.      Assessment/Plan:    71 year old female with known history of abdominal aortic aneurysm which I saw her 18 months ago recommended 2-year follow-up.  She was subsequently found to have up to 4.1 cm aneurysm by screening CT for her lungs in September and more recently a concerning CT finding of stranding around the proximal neck of the aneurysm.  That aneurysm measured up to 4.3 cm.  This was reviewed by Andrea Little at the time and thought to not be urgent.  I reviewed the CT scan with the patient today again does  not appear to be stranding in urgent but does appear to have an outpouching near the neck of the aneurysm which would give it a much larger diameter approximately 5.7 cm.  The CT is not adequate for planning difficult surgical repair given that I do not think patient is a strong open repair  candidate although this may be her only option given the very diminutive renal arteries.  At the very least she would likely lose her left renal artery with a very diminutive left kidney.  We will have a CT scan repeated with contrast and thin cuts in 4 to 6 weeks and I will see her back at that time to discuss operative repair depending on the formal sizing of the aneurysm at that time.     [name] MD [facility]

## 2022-07-14 DIAGNOSIS — M47816 Spondylosis without myelopathy or radiculopathy, lumbar region: Secondary | ICD-10-CM | POA: Diagnosis not present

## 2022-07-20 ENCOUNTER — Other Ambulatory Visit: Payer: Self-pay

## 2022-07-20 DIAGNOSIS — I7142 Juxtarenal abdominal aortic aneurysm, without rupture: Secondary | ICD-10-CM

## 2022-07-24 ENCOUNTER — Ambulatory Visit (HOSPITAL_COMMUNITY): Payer: Medicare HMO

## 2022-07-29 ENCOUNTER — Ambulatory Visit (HOSPITAL_COMMUNITY)
Admission: RE | Admit: 2022-07-29 | Discharge: 2022-07-29 | Disposition: A | Discharge status: Home / Self Care | Payer: Medicare HMO | Source: Ambulatory Visit | Attending: Vascular Surgery | Admitting: Vascular Surgery

## 2022-07-29 DIAGNOSIS — N189 Chronic kidney disease, unspecified: Secondary | ICD-10-CM | POA: Insufficient documentation

## 2022-07-29 DIAGNOSIS — I7143 Infrarenal abdominal aortic aneurysm, without rupture: Secondary | ICD-10-CM | POA: Diagnosis not present

## 2022-07-29 DIAGNOSIS — E871 Hypo-osmolality and hyponatremia: Secondary | ICD-10-CM | POA: Diagnosis not present

## 2022-07-29 DIAGNOSIS — K8020 Calculus of gallbladder without cholecystitis without obstruction: Secondary | ICD-10-CM | POA: Diagnosis not present

## 2022-07-29 DIAGNOSIS — I7142 Juxtarenal abdominal aortic aneurysm, without rupture: Secondary | ICD-10-CM | POA: Diagnosis not present

## 2022-07-29 LAB — POCT I-STAT CREATININE: Creatinine, Ser: 1 mg/dL (ref 0.44–1.00)

## 2022-07-29 MED ORDER — IOHEXOL 350 MG/ML SOLN
75.0000 mL | Freq: Once | INTRAVENOUS | Status: AC | PRN
  Administered 2022-07-29: 75 mL via INTRAVENOUS

## 2022-08-05 ENCOUNTER — Encounter: Payer: Self-pay | Admitting: Vascular Surgery

## 2022-08-05 ENCOUNTER — Ambulatory Visit: Payer: Medicare HMO | Admitting: Vascular Surgery

## 2022-08-05 VITALS — BP 155/84 | HR 60 | Temp 98.0°F | Resp 20 | Ht 70.0 in | Wt 161.8 lb

## 2022-08-05 DIAGNOSIS — I7142 Juxtarenal abdominal aortic aneurysm, without rupture: Secondary | ICD-10-CM | POA: Diagnosis not present

## 2022-08-05 NOTE — Progress Notes (Signed)
Patient ID: Andrea Little, female   DOB: 03-19-1951, 72 y.o.   MRN: [id number]  Reason for Consult: Follow-up   Referred by Mckinley Jewel, MD  Subjective:     HPI:  Andrea Little is a 72 y.o. female with abdominal aortic aneurysm which was known to have recently grown in size.  She now follows up with CT scan.  Continues to have abdominal pain this is chronic in nature.  No new back pain and no new abdominal pain.  She has been followed by Dr. Virgina Jock with cardiology in the past.  Past Medical History:  Diagnosis Date   AAA (abdominal aortic aneurysm) ([place])    Back pain    Cancer ([place])    skin   Chronic kidney disease    COPD (chronic obstructive pulmonary disease) ([place])    Hyperlipidemia    Hypertension    Major depression    Mood disorder ([place])    Tremor    History reviewed. No pertinent family history. History reviewed. No pertinent surgical history.  Short Social History:  Social History   Tobacco Use   Smoking status: Every Day    Packs/day: 1.00    Types: Cigarettes   Smokeless tobacco: Never   Tobacco comments:    1 ppd   Substance Use Topics   Alcohol use: Not Currently    Allergies  Allergen Reactions   Atorvastatin     Other reaction(s): leg cramps   Hydrochlorothiazide     Other reaction(s): hyponatremia   Penicillin G     Other reaction(s): Other (See Comments) As a child does not recall the reaction     Current Outpatient Medications  Medication Sig Dispense Refill   acetaminophen (TYLENOL) 500 MG tablet 2 tablets as needed     ALPRAZolam (XANAX) 1 MG tablet Take 1 mg by mouth 3 (three) times daily as needed.     amLODipine (NORVASC) 5 MG tablet Take 5 mg by mouth daily.     atenolol (TENORMIN) 100 MG tablet Take 100 mg by mouth daily.     benazepril (LOTENSIN) 40 MG tablet Take 40 mg by mouth daily.     Calcium Carbonate-Vit D-Min (CALCIUM 1200) 1200-1000 MG-UNIT CHEW 1 tablet     fluticasone (FLONASE) 50 MCG/ACT nasal spray       traMADol (ULTRAM) 50 MG tablet Take 50 mg by mouth every 6 (six) hours as needed.     umeclidinium-vilanterol (ANORO ELLIPTA) 62.5-25 MCG/ACT AEPB Inhale 1 puff into the lungs daily. 60 each 11   venlafaxine XR (EFFEXOR-XR) 150 MG 24 hr capsule Take 150 mg by mouth daily. with food     fexofenadine (ALLEGRA) 180 MG tablet Take 180 mg by mouth daily as needed. (Patient not taking: Reported on 08/05/2022)     No current facility-administered medications for this visit.    Review of Systems  Constitutional:  Constitutional negative. HENT: HENT negative.  Eyes: Eyes negative.  Respiratory: Respiratory negative.  Cardiovascular: Cardiovascular negative.  GI: Positive for abdominal pain.  Musculoskeletal: Musculoskeletal negative.  Skin: Skin negative.  Neurological: Neurological negative. Hematologic: Hematologic/lymphatic negative.  Psychiatric: Psychiatric negative.        Objective:  Objective   Vitals:   08/05/22 1348  BP: (!) 155/84  Pulse: 60  Resp: 20  Temp: 98 F (36.7 C)  SpO2: 95%     Physical Exam HENT:     Head: Normocephalic.     Mouth/Throat:     Mouth: Mucous membranes are moist.  Eyes:     Pupils: Pupils are equal, round, and reactive to light.  Cardiovascular:     Rate and Rhythm: Normal rate.     Pulses: Normal pulses.  Abdominal:     General: Abdomen is flat.     Palpations: There is mass.  Musculoskeletal:        General: Normal range of motion.     Cervical back: Normal range of motion and neck supple.     Right lower leg: No edema.     Left lower leg: No edema.  Skin:    General: Skin is warm.     Capillary Refill: Capillary refill takes less than 2 seconds.  Neurological:     General: No focal deficit present.     Mental Status: She is alert.  Psychiatric:        Mood and Affect: Mood normal.        Behavior: Behavior normal.        Thought Content: Thought content normal.        Judgment: Judgment normal.     Data: CT  IMPRESSION: 1. Since last month's study, no significant enlargement of 5.6 cm infrarenal abdominal aortic aneurysm with medial saccular component. Some decrease in the periaortic inflammatory/edematous changes seen previously. Current guidelines recommend referral to a vascular specialist (the patient is currently under the care of a vascular specialist/surgeon). 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 3. Cholelithiasis. 4.  Aortic Atherosclerosis (ICD10-I70.0).     Assessment/Plan:    72 year old female now 5.6 cm abdominal aortic aneurysm.  Doubtful that her symptoms are related to the aneurysm.  We discussed the need for repair I discussed that this can likely be performed endovascularly but may require a fenestrated endovascular repair to include the right renal artery.  Left renal artery appears occluded in its takeoff and will need to be sacrificed.  We will get her evaluated by cardiology and plan for repair in the near future.  If plans need to change after sizing for endovascular repair I will reach out to the patient and I discussed this with her today.     [name] MD [facility]

## 2022-08-07 ENCOUNTER — Encounter: Payer: Self-pay | Admitting: Internal Medicine

## 2022-08-07 ENCOUNTER — Ambulatory Visit: Payer: Medicare HMO | Attending: Internal Medicine | Admitting: Internal Medicine

## 2022-08-07 VITALS — BP 160/80 | HR 71 | Ht 70.0 in | Wt 160.8 lb

## 2022-08-07 DIAGNOSIS — E785 Hyperlipidemia, unspecified: Secondary | ICD-10-CM

## 2022-08-07 DIAGNOSIS — R0602 Shortness of breath: Secondary | ICD-10-CM

## 2022-08-07 MED ORDER — AMLODIPINE BESYLATE 5 MG PO TABS: 5.0000 mg | ORAL_TABLET | Freq: Two times a day (BID) | ORAL | 3 refills | Status: DC

## 2022-08-07 MED ORDER — ROSUVASTATIN CALCIUM 5 MG PO TABS: 5.0000 mg | ORAL_TABLET | Freq: Every day | ORAL | 3 refills | Status: DC

## 2022-08-07 NOTE — Progress Notes (Signed)
Cardiology Office Note   Date:  08/07/2022   ID:  Andrea Little, DOB Apr 30, 1951, MRN [id number]  PCP:  Mckinley Jewel, MD  Cardiologist:   Dorris Carnes, MD   Pt presents for preop assessment       History of Present Illness: Andrea Little is a 72 y.o. female with a history of HTN, HL, tobacco use   Found th have a 5.9 cm AAA   Plan for endovascular repair  The pt denies CP   Says she gets very SOB theough with minimal activity   Does minimal due to back pain (due to have an ablation next week)    No palpitattions   No LE edema      Current Meds  Medication Sig   ALPRAZolam (XANAX) 1 MG tablet Take 1 mg by mouth 3 (three) times daily as needed.   amLODipine (NORVASC) 5 MG tablet Take 5 mg by mouth daily.   atenolol (TENORMIN) 100 MG tablet Take 100 mg by mouth daily.   benazepril (LOTENSIN) 40 MG tablet Take 40 mg by mouth daily.   Calcium Carbonate-Vit D-Min (CALCIUM 1200) 1200-1000 MG-UNIT CHEW 1 tablet   traMADol (ULTRAM) 50 MG tablet Take 50 mg by mouth every 6 (six) hours as needed.   umeclidinium-vilanterol (ANORO ELLIPTA) 62.5-25 MCG/ACT AEPB Inhale 1 puff into the lungs daily.   venlafaxine XR (EFFEXOR-XR) 150 MG 24 hr capsule Take 150 mg by mouth daily. with food     Allergies:   Atorvastatin, Hydrochlorothiazide, and Penicillin g   Past Medical History:  Diagnosis Date   AAA (abdominal aortic aneurysm) (HCC)    Back pain    Cancer ([place])    skin   Chronic kidney disease    COPD (chronic obstructive pulmonary disease) (HCC)    Hyperlipidemia    Hypertension    Major depression    Mood disorder (HCC)    Tremor     No past surgical history on file.   Social History:  The patient  reports that she has been smoking cigarettes. She has been smoking an average of 1 pack per day. She has never used smokeless tobacco. She reports that she does not currently use alcohol. She reports that she does not use drugs.   Family History:  The patient's family history is not  on file.    ROS:  Please see the history of present illness. All other systems are reviewed and  Negative to the above problem except as noted.    PHYSICAL EXAM: VS:  BP (!) 160/80   Pulse 71   Ht '5\' 10"'$  (1.778 m)   Wt 160 lb 12.8 oz (72.9 kg)   SpO2 93%   BMI 23.07 kg/m   GEN: Well nourished, well developed, in no acute distress  HEENT: normal  Neck: no JVD, carotid bruits Cardiac: RRR; no murmurs  No LE edema  Respiratory:  clear to auscultation bilaterally  Decreased B GI: soft, nontender, nondistended  Aorta palpible MS: no deformity Moving all extremities   Skin: warm and dry, no rash Neuro:  Strength and sensation are intact Psych: euthymic mood, full affect   EKG:  EKG is  not ordered today.   Lipid Panel No results found for: "CHOL", "TRIG", "HDL", "CHOLHDL", "VLDL", "LDLCALC", "LDLDIRECT"    Wt Readings from Last 3 Encounters:  08/07/22 160 lb 12.8 oz (72.9 kg)  08/05/22 161 lb 12.8 oz (73.4 kg)  06/24/22 158 lb 6.4 oz (71.8 kg)  ASSESSMENT AND PLAN:  1  Cardiac risk assessment  Pt with CAD documented on CT scan   Does minimal activity due to back pain  Gets SOB with that   REcomm Lexiscan myoview to r/o ischemia  2 PVOD  Plan for endovascular repair   Risk facto modification to prevent more problems   2  CAD  Pt with evid of CAD  Myoview as noted above   3  HL  Would recomm starting Crestor 5 mg   Follow up lipomed in 8 wks with liver panel   4  HTN  BP is high   REcomm increaing amlodipine to 5 mg bid   5  Tob   Still smoking a ppd   Cut bac  Follow up later this spring  Current medicines are reviewed at length with the patient today.  The patient does not have concerns regarding medicines.  Signed, Dorris Carnes, MD  08/07/2022 3:12 PM    [address], [place], [place]  [address] Phone: [phone]; Fax: [phone]

## 2022-08-07 NOTE — Patient Instructions (Addendum)
Medication Instructions:  INCREASE AMLODIPINE TO 5 MG TWICE A DAY  START ROSUVASTATIN 5 MG A DAY   *If you need a refill on your cardiac medications before your next appointment, please call your pharmacy*   LABWORK;  NMR, APO B, LIPO A, HEPATIC IN 8 WEEKS FASTING   If you have labs (blood work) drawn today and your tests are completely normal, you will receive your results only by: MyChart Message (if you have MyChart) OR A paper copy in the mail If you have any lab test that is abnormal or we need to change your treatment, we will call you to review the results.   Testing/Procedures: .Your physician has requested that you have an echocardiogram. Echocardiography is a painless test that uses sound waves to create images of your heart. It provides your doctor with information about the size and shape of your heart and how well your heart's chambers and valves are working. This procedure takes approximately one hour. There are no restrictions for this procedure. Please do NOT wear cologne, perfume, aftershave, or lotions (deodorant is allowed). Please arrive 15 minutes prior to your appointment time.  Your physician has requested that you have a lexiscan myoview. For further information please visit [email or web address]. Please follow instruction sheet, as given.     Follow-Up: At [facility], you and your health needs are our priority.  As part of our continuing mission to provide you with exceptional heart care, we have created designated Provider Care Teams.  These Care Teams include your primary Cardiologist (physician) and Advanced Practice Providers (APPs -  Physician Assistants and Nurse Practitioners) who all work together to provide you with the care you need, when you need it.  We recommend signing up for the patient portal called "MyChart".  Sign up information is provided on this After Visit Summary.  MyChart is used to connect with patients for Virtual Visits  (Telemedicine).  Patients are able to view lab/test results, encounter notes, upcoming appointments, etc.  Non-urgent messages can be sent to your provider as well.   To learn more about what you can do with MyChart, go to [email or web address].    Your next appointment:   3 month(s)  The format for your next appointment:   In Person  Provider:   DR Dorris Carnes    Other Instructions   Important Information About Sugar

## 2022-08-11 DIAGNOSIS — M47816 Spondylosis without myelopathy or radiculopathy, lumbar region: Secondary | ICD-10-CM | POA: Diagnosis not present

## 2022-08-26 ENCOUNTER — Telehealth (HOSPITAL_COMMUNITY): Payer: Self-pay | Admitting: *Deleted

## 2022-08-26 NOTE — Telephone Encounter (Signed)
Left message on voicemail per DPR in reference to upcoming appointment scheduled on 09/02/22 with detailed instructions given per Myocardial Perfusion Study Information Sheet for the test. LM to arrive 15 minutes early, and that it is imperative to arrive on time for appointment to keep from having the test rescheduled. If you need to cancel or reschedule your appointment, please call the office within 24 hours of your appointment. Failure to do so may result in a cancellation of your appointment, and a $50 no show fee. Phone number given for call back for any questions. Kirstie Peri, RN

## 2022-08-31 DIAGNOSIS — M5451 Vertebrogenic low back pain: Secondary | ICD-10-CM | POA: Diagnosis not present

## 2022-08-31 DIAGNOSIS — M47816 Spondylosis without myelopathy or radiculopathy, lumbar region: Secondary | ICD-10-CM | POA: Diagnosis not present

## 2022-09-02 ENCOUNTER — Ambulatory Visit (HOSPITAL_COMMUNITY): Payer: Medicare HMO

## 2022-09-02 ENCOUNTER — Telehealth (INDEPENDENT_AMBULATORY_CARE_PROVIDER_SITE_OTHER): Payer: Medicare HMO

## 2022-09-02 ENCOUNTER — Encounter (HOSPITAL_COMMUNITY): Payer: Self-pay | Admitting: Emergency Medicine

## 2022-09-02 ENCOUNTER — Other Ambulatory Visit (HOSPITAL_BASED_OUTPATIENT_CLINIC_OR_DEPARTMENT_OTHER): Payer: Medicare HMO

## 2022-09-02 ENCOUNTER — Other Ambulatory Visit: Payer: Self-pay

## 2022-09-02 ENCOUNTER — Emergency Department (HOSPITAL_COMMUNITY): Payer: Medicare HMO

## 2022-09-02 ENCOUNTER — Emergency Department (HOSPITAL_COMMUNITY)
Admission: EM | Admit: 2022-09-02 | Discharge: 2022-09-02 | Disposition: A | Discharge status: Home / Self Care | Payer: Medicare HMO | Attending: Emergency Medicine | Admitting: Emergency Medicine

## 2022-09-02 DIAGNOSIS — Y92481 Parking lot as the place of occurrence of the external cause: Secondary | ICD-10-CM | POA: Insufficient documentation

## 2022-09-02 DIAGNOSIS — S0181XA Laceration without foreign body of other part of head, initial encounter: Secondary | ICD-10-CM | POA: Diagnosis not present

## 2022-09-02 DIAGNOSIS — Z79899 Other long term (current) drug therapy: Secondary | ICD-10-CM | POA: Insufficient documentation

## 2022-09-02 DIAGNOSIS — S60229A Contusion of unspecified hand, initial encounter: Secondary | ICD-10-CM | POA: Diagnosis not present

## 2022-09-02 DIAGNOSIS — W19XXXA Unspecified fall, initial encounter: Secondary | ICD-10-CM | POA: Diagnosis not present

## 2022-09-02 DIAGNOSIS — N189 Chronic kidney disease, unspecified: Secondary | ICD-10-CM | POA: Insufficient documentation

## 2022-09-02 DIAGNOSIS — W1839XA Other fall on same level, initial encounter: Secondary | ICD-10-CM | POA: Diagnosis not present

## 2022-09-02 DIAGNOSIS — I129 Hypertensive chronic kidney disease with stage 1 through stage 4 chronic kidney disease, or unspecified chronic kidney disease: Secondary | ICD-10-CM | POA: Insufficient documentation

## 2022-09-02 DIAGNOSIS — S0083XA Contusion of other part of head, initial encounter: Secondary | ICD-10-CM

## 2022-09-02 DIAGNOSIS — J449 Chronic obstructive pulmonary disease, unspecified: Secondary | ICD-10-CM | POA: Diagnosis not present

## 2022-09-02 DIAGNOSIS — M549 Dorsalgia, unspecified: Secondary | ICD-10-CM | POA: Diagnosis not present

## 2022-09-02 DIAGNOSIS — E785 Hyperlipidemia, unspecified: Secondary | ICD-10-CM

## 2022-09-02 DIAGNOSIS — R0602 Shortness of breath: Secondary | ICD-10-CM | POA: Diagnosis not present

## 2022-09-02 DIAGNOSIS — Y9301 Activity, walking, marching and hiking: Secondary | ICD-10-CM | POA: Diagnosis not present

## 2022-09-02 DIAGNOSIS — J9811 Atelectasis: Secondary | ICD-10-CM | POA: Diagnosis not present

## 2022-09-02 DIAGNOSIS — S0003XA Contusion of scalp, initial encounter: Secondary | ICD-10-CM | POA: Diagnosis not present

## 2022-09-02 DIAGNOSIS — S0990XA Unspecified injury of head, initial encounter: Secondary | ICD-10-CM | POA: Diagnosis not present

## 2022-09-02 MED ORDER — LIDOCAINE-EPINEPHRINE-TETRACAINE (LET) TOPICAL GEL
3.0000 mL | Freq: Once | TOPICAL | Status: AC
  Administered 2022-09-02: 3 mL via TOPICAL
  Filled 2022-09-02: qty 3

## 2022-09-02 NOTE — Discharge Instructions (Signed)
X-rays look okay today.  Make sure you follow-up with your cardiologist for the test that they were planning on doing.  It may benefit you to get a walker so you can use it at home so you do not fall.

## 2022-09-02 NOTE — Telephone Encounter (Signed)
At 940 am patient Andrea Little arrived at [facility] on [address] In desk via wheelchair stating she had fallen in the [place] parking lot a few moments ago.    Pt states she parked her car, and began walking towards the building.  Pt had an Echocardiogram and Nuc Med Stress Test scheduled for this morning.  Pt states a little after 930 am on 09/02/2022, she lost her balance an fell into a wall, striking her left head / face, left arm, left chest, then dropped to the ground.  Pt states her glasses struck her face, causing a laceration above her left eye, and her glasses were broken, as a result of the fall.  ( Pt states she did not pass out, and remembered striking the wall / then falling to the ground. )  A bystander saw her fall, and the unknown bystander obtained a wheelchair, helped the patient into the wheelchair, and assisted her into the elevator to report to [address] / 3rd floor check in desk at [facility].  I was present at check in, and was dropping of an item at the front desk.  I was asked to assist the patient as she told the staff that she had just fallen outside.  Working in [employer] today, I obtained our first aid bag, and Fritzi Mandes helped me take the patient to Exam room 2 in [place].    The patient was responding appropriately to questions asked, but while asking a couple questions, seemed slower to answer.  Her vital signs were 150/78, RT Arm, HR 48, R 20, and O2 Sat 98% on room air.  Pt also had a half inch laceration above her left eye, which I covered with a gauze dressing and tape.  Pt had two hematomas on her left forearm / elbow area, and hematomas on her right hand.  Pt was c/o of low back pain, but she also stated she has a history of this.    While waiting on DOD, Dr. Johney Frame to do a post fall assessment, Pt asked for a drink of water.  Pt had brought a large travel water cup with her to the appointment.  As I was packing our First Aid bag, without asking me,  Pt took a 1 mg Xanax tablet.  By the time I realized what she was doing, it was too late to stop her from doing this.  At this time, Fritzi Mandes and I had the Pt contact her husband to let him know she fell, and that she will be going to the ER for care.   Pt continued to answer questions appropriately, but her confusion seemed to increase, and were wondering if the Pt had a concussion?  After Dr. Johney Frame was briefed on all the information obtained, the best plan of care was to have her transported to the ER, and an ER provider may order a head CT.  We had Pt contact her husband while he was in route to [place], to meet the her at the [facility] ER instead of coming to [place] on [address].  An EKG was obtained prior to EMS arrival to Exam room 2.    EMS was given a full report on the above information, including Pt taking 1 mg Xanax tablet without nursing's permission.  EMS was advised that the Pt's husband will meet them in the ER.

## 2022-09-02 NOTE — ED Provider Notes (Signed)
..  Laceration Repair  Date/Time: 09/02/2022 1:36 PM  Performed by: Hendricks Limes, PA-C Authorized by: Hendricks Limes, PA-C   Consent:    Consent obtained:  Verbal   Consent given by:  Patient   Risks, benefits, and alternatives were discussed: yes     Risks discussed:  Infection Universal protocol:    Procedure explained and questions answered to patient or proxy's satisfaction: yes     Relevant documents present and verified: yes     Test results available: yes     Imaging studies available: yes     Required blood products, implants, devices, and special equipment available: yes     Site/side marked: no     Immediately prior to procedure, a time out was called: no     Patient identity confirmed:  Verbally with patient and arm band Anesthesia:    Anesthesia method:  Local infiltration   Local anesthetic:  Lidocaine 1% w/o epi Laceration details:    Location:  Face   Face location:  Forehead   Length (cm):  1   Depth (mm):  0.5 Pre-procedure details:    Preparation:  Patient was prepped and draped in usual sterile fashion Exploration:    Limited defect created (wound extended): no     Hemostasis achieved with:  LET   Imaging obtained comment:  CT head   Imaging outcome: foreign body not noted     Wound exploration: wound explored through full range of motion and entire depth of wound visualized     Wound extent: areolar tissue not violated, fascia not violated, no foreign body, no signs of injury, no nerve damage, no tendon damage, no underlying fracture and no vascular damage   Treatment:    Area cleansed with:  Saline   Amount of cleaning:  Standard   Irrigation solution:  Sterile saline   Debridement:  None   Undermining:  None Skin repair:    Repair method:  Sutures   Suture size:  5-0   Suture material:  Prolene   Suture technique:  Simple interrupted   Number of sutures:  1 Approximation:    Approximation:  Close Repair type:    Repair type:   Simple Post-procedure details:    Dressing:  Open (no dressing)   Procedure completion:  Tolerated well, no immediate complications     Hendricks Limes, PA-C 09/02/22 1338    Blanchie Dessert, MD 09/02/22 1448

## 2022-09-02 NOTE — ED Provider Notes (Signed)
[facility] Provider Note   CSN: [id number] Arrival date & time: 09/02/22  1122     History  Chief Complaint  Patient presents with   Andrea Little    Andrea Little is a 72 y.o. female.  Patient is a 72 year old female with a history of hypertension, hyperlipidemia, CKD, AAA, COPD and chronic back pain who is presenting today after a fall.  Patient was going to the cardiology office today for a stress test and an echo following up on this known AAA that has increased in size.  She reports that as she was walking she felt her legs give out and she fell against the side of the wall by a van.  She then slid to the floor.  She broke her glasses and cut over the left side of her head.  She denies any loss of consciousness, dizziness or chest pain prior to the episode.  She reports she suffers from back pain all the time and she normally just sits around and rarely gets up and walks.  She reports at this point she does not even grocery shop anymore and she had to park in the parking garage and walk and she now realizes it was too far.  When she arrived at the cardiology office they noted that she was awake and alert but seems slightly slower to respond than baseline.  They also noted that when she was sitting in the office she took a Xanax before they could stop her but otherwise has been awake and alert.  They sent her here for further evaluation.  Patient denies taking any anticoagulants.  She reports that she is short of breath all the time but does not feel like it is any worse and denies feeling palpitations or lightheaded prior to this episode.  The history is provided by the patient and medical records.  Fall       Home Medications Prior to Admission medications   Medication Sig Start Date End Date Taking? Authorizing Provider  ALPRAZolam Duanne Moron) 1 MG tablet Take 1 mg by mouth 3 (three) times daily as needed. 09/26/20   [provider]   amLODipine (NORVASC) 5 MG tablet Take 1 tablet (5 mg total) by mouth 2 (two) times daily. 08/07/22   Fay Records, MD  atenolol (TENORMIN) 100 MG tablet Take 100 mg by mouth daily. 10/17/20   [provider]  benazepril (LOTENSIN) 40 MG tablet Take 40 mg by mouth daily. 09/12/20   [provider]  Calcium Carbonate-Vit D-Min (CALCIUM 1200) 1200-1000 MG-UNIT CHEW 1 tablet    [provider]  rosuvastatin (CRESTOR) 5 MG tablet Take 1 tablet (5 mg total) by mouth daily. 08/07/22   Fay Records, MD  traMADol (ULTRAM) 50 MG tablet Take 50 mg by mouth every 6 (six) hours as needed.    [provider]  umeclidinium-vilanterol (ANORO ELLIPTA) 62.5-25 MCG/ACT AEPB Inhale 1 puff into the lungs daily. 09/10/21   Hunsucker, Bonna Gains, MD  venlafaxine XR (EFFEXOR-XR) 150 MG 24 hr capsule Take 150 mg by mouth daily. with food 08/03/20   [provider]      Allergies    Atorvastatin, Hydrochlorothiazide, and Penicillin g    Review of Systems   Review of Systems  Physical Exam Updated Vital Signs BP (!) 157/70   Pulse (!) 59   Temp 97.6 F (36.4 C) (Oral)   Resp 18   Ht '5\' 10"'$  (1.778 m)  Wt 72.6 kg   SpO2 92%   BMI 22.96 kg/m  Physical Exam Vitals and nursing note reviewed.  Constitutional:      General: She is not in acute distress.    Appearance: She is well-developed.  HENT:     Head: Normocephalic.      Comments: 0.5 cm laceration with persistent bleeding Eyes:     Pupils: Pupils are equal, round, and reactive to light.  Cardiovascular:     Rate and Rhythm: Normal rate and regular rhythm.     Heart sounds: Normal heart sounds. No murmur heard.    No friction rub.  Pulmonary:     Effort: Pulmonary effort is normal.     Breath sounds: Rhonchi present. No wheezing or rales.  Abdominal:     General: Bowel sounds are normal. There is no distension.     Palpations: Abdomen is soft.     Tenderness: There is no abdominal tenderness. There is no  guarding or rebound.  Musculoskeletal:        General: No tenderness. Normal range of motion.     Cervical back: Normal range of motion and neck supple.     Comments: No edema  Skin:    General: Skin is warm and dry.     Findings: No rash.  Neurological:     Mental Status: She is alert and oriented to person, place, and time. Mental status is at baseline.     Cranial Nerves: No cranial nerve deficit.     Sensory: No sensory deficit.     Motor: No weakness.     Comments: Patient seems slightly sleepy but is able to answer all questions appropriately.  No aphasia or focal deficits  Psychiatric:        Behavior: Behavior normal.     ED Results / Procedures / Treatments   Labs (all labs ordered are listed, but only abnormal results are displayed) Labs Reviewed - No data to display  EKG None  Radiology DG Chest [facility] 1 View  Result Date: 09/02/2022 CLINICAL DATA:  Cough.  Shortness of breath. EXAM: PORTABLE CHEST 1 VIEW COMPARISON:  04/17/2021 FINDINGS: Heart size appears normal. Aortic atherosclerosis. Low lung volumes. Mild subsegmental atelectasis noted in the lung bases. No airspace opacities. IMPRESSION: Low lung volumes and mild bibasilar atelectasis. Electronically Signed   By: Kerby Moors M.D.   On: 09/02/2022 12:42   CT Head Wo Contrast  Result Date: 09/02/2022 CLINICAL DATA:  Head trauma.  Status post fall. EXAM: CT HEAD WITHOUT CONTRAST TECHNIQUE: Contiguous axial images were obtained from the base of the skull through the vertex without intravenous contrast. RADIATION DOSE REDUCTION: This exam was performed according to the departmental dose-optimization program which includes automated exposure control, adjustment of the mA and/or kV according to patient size and/or use of iterative reconstruction technique. COMPARISON:  None Available. FINDINGS: Brain: No evidence of acute infarction, hemorrhage, hydrocephalus, extra-axial collection or mass lesion/mass effect. There is  mild low-attenuation within the subcortical and periventricular white matter compatible with chronic microvascular disease. Vascular: No hyperdense vessel or unexpected calcification. Skull: Normal. Negative for fracture or focal lesion. Sinuses/Orbits: No acute finding. Other: Left temporal scalp laceration and small hematoma noted, image 20/3. IMPRESSION: 1. No acute intracranial abnormalities. 2. Mild chronic microvascular disease. 3. Left temporal scalp laceration and small hematoma. Electronically Signed   By: Kerby Moors M.D.   On: 09/02/2022 12:36    Procedures Procedures    Medications Ordered in ED Medications  lidocaine-EPINEPHrine-tetracaine (LET) topical gel (3 mLs Topical Given 09/02/22 1157)    ED Course/ Medical Decision Making/ A&P                             Medical Decision Making Amount and/or Complexity of Data Reviewed Radiology: ordered.   Pt with multiple medical problems and comorbidities and presenting today with a complaint that caries a high risk for morbidity and mortality.  Here today after a fall.  Patient is neurovascularly intact at this time.  Wound repaired on the face as above.  Patient is not anticoagulated and I have independently visualized and interpreted pt's images today.  Head CT without intracranial hemorrhage today.  Patient does have a coarse cough and and reports that this is baseline for her.  She denies new shortness of breath and does not appear fluid overloaded today.  Patient has a known AAA which is currently getting a workup but do not feel that it is the cause of her symptoms today.  Patient did take a Xanax in the cardiology office and is slightly sleepy but otherwise appropriate.  2:48 PM Chest x-ray without acute findings.  Wound repaired per PA note.  At this time no indication for admission or further intervention.  Will discharge patient home.          Final Clinical Impression(s) / ED Diagnoses Final diagnoses:  Fall,  initial encounter  Forehead laceration, initial encounter  Contusion of hand, unspecified laterality, initial encounter    Rx / DC Orders ED Discharge Orders     None         Blanchie Dessert, MD 09/02/22 1448

## 2022-09-02 NOTE — ED Triage Notes (Signed)
Pt BIB [facility] from cardiology office due to falling in parking lot.  [facility] states there was stress test done in office.  Pt does have laceration above left eyebrow.  Bleeding controlled.  Pt took Xanax against provider orders at the cardiology office.  VS BP 150/78, HR 72, CBG 95

## 2022-09-03 ENCOUNTER — Telehealth: Payer: Self-pay

## 2022-09-03 ENCOUNTER — Telehealth: Payer: Self-pay | Admitting: Internal Medicine

## 2022-09-03 NOTE — Telephone Encounter (Signed)
I spoke with the pt and advised her that I have her AVS from her previous visit and stress test instructions... I will put in an envelope and place at the front desk for her when she comes in for her testing.

## 2022-09-03 NOTE — Telephone Encounter (Addendum)
Addendum to 09/02/2022.Marland Kitchen  Pt had advised me after her fall that she falls all of the time and it is a result of chronic back pain and she says that is affects her legs. Pt did not have a cane or walker with her.

## 2022-09-03 NOTE — Addendum Note (Signed)
Addended by: Janan Halter F on: 09/03/2022 05:51 PM   Modules accepted: Orders

## 2022-09-03 NOTE — Telephone Encounter (Signed)
Pt would like a callback regarding paperwork that was dropped off. Please advise

## 2022-09-03 NOTE — Telephone Encounter (Signed)
LM for the pt to be sure she has her instructions for the new dates for the stress test and echo.   Myoview 09/10/22 10:15 am  Echo 09/24/22 2:30 pm

## 2022-09-03 NOTE — Telephone Encounter (Signed)
Pt called stating that she was scheduled for a stress test and echo on 1/31, but she fell in the parking garage on her way there and went to [facility] ED. Her appts have been r/s for 2/10 (stress test) and 2/22 (echo). This testing is for cardiac clearance for [facility]. Staff msg sent to Dr. Donzetta Matters.

## 2022-09-08 ENCOUNTER — Telehealth (HOSPITAL_COMMUNITY): Payer: Self-pay | Admitting: *Deleted

## 2022-09-08 NOTE — Telephone Encounter (Signed)
Followed up with the pt and she verbalized understanding of her instructions and the new dates and times of her stress test and echo.   Her papers that she left here at the office will be at the front office for her.

## 2022-09-08 NOTE — Telephone Encounter (Signed)
Per DPR left detailed message for MPI study scheduled on 09/10/22.

## 2022-09-10 ENCOUNTER — Ambulatory Visit (HOSPITAL_COMMUNITY): Payer: Medicare HMO | Attending: Internal Medicine

## 2022-09-10 VITALS — Ht 70.0 in | Wt 160.0 lb

## 2022-09-10 DIAGNOSIS — R0602 Shortness of breath: Secondary | ICD-10-CM

## 2022-09-10 DIAGNOSIS — E785 Hyperlipidemia, unspecified: Secondary | ICD-10-CM | POA: Diagnosis not present

## 2022-09-10 DIAGNOSIS — Z01818 Encounter for other preprocedural examination: Secondary | ICD-10-CM | POA: Diagnosis not present

## 2022-09-10 LAB — MYOCARDIAL PERFUSION IMAGING
LV dias vol: 57 mL (ref 46–106)
LV sys vol: 18 mL
Nuc Stress EF: 68 %
Peak HR: 75 {beats}/min
Rest HR: 59 {beats}/min
Rest Nuclear Isotope Dose: 10.6 mCi
SDS: 1
SRS: 0
SSS: 1
ST Depression (mm): 0 mm
Stress Nuclear Isotope Dose: 32.4 mCi
TID: 1.08

## 2022-09-10 MED ORDER — TECHNETIUM TC 99M TETROFOSMIN IV KIT
10.6000 | PACK | Freq: Once | INTRAVENOUS | Status: AC | PRN
  Administered 2022-09-10: 10.6 via INTRAVENOUS

## 2022-09-10 MED ORDER — REGADENOSON 0.4 MG/5ML IV SOLN
0.4000 mg | Freq: Once | INTRAVENOUS | Status: AC
  Administered 2022-09-10: 0.4 mg via INTRAVENOUS

## 2022-09-10 MED ORDER — AMINOPHYLLINE 25 MG/ML IV SOLN
75.0000 mg | Freq: Once | INTRAVENOUS | Status: AC
  Administered 2022-09-10: 75 mg via INTRAVENOUS

## 2022-09-10 MED ORDER — TECHNETIUM TC 99M TETROFOSMIN IV KIT
32.4000 | PACK | Freq: Once | INTRAVENOUS | Status: AC | PRN
  Administered 2022-09-10: 32.4 via INTRAVENOUS

## 2022-09-15 ENCOUNTER — Other Ambulatory Visit: Payer: Self-pay

## 2022-09-15 DIAGNOSIS — I7142 Juxtarenal abdominal aortic aneurysm, without rupture: Secondary | ICD-10-CM

## 2022-09-24 ENCOUNTER — Ambulatory Visit (HOSPITAL_COMMUNITY): Payer: Medicare HMO | Attending: Internal Medicine

## 2022-09-24 DIAGNOSIS — E785 Hyperlipidemia, unspecified: Secondary | ICD-10-CM | POA: Insufficient documentation

## 2022-09-24 DIAGNOSIS — R0602 Shortness of breath: Secondary | ICD-10-CM | POA: Diagnosis not present

## 2022-09-24 LAB — ECHOCARDIOGRAM COMPLETE
Area-P 1/2: 2.61 cm2
S' Lateral: 2 cm

## 2022-09-28 ENCOUNTER — Telehealth: Payer: Self-pay | Admitting: Internal Medicine

## 2022-09-28 NOTE — Telephone Encounter (Signed)
  ECHOCARDIOGRAM COMPLETE: Result Notes   Fay Records, MD 09/27/2022  1:34 PM EST     Pumping function of LV and RV are normal.  LV relaxation is normal Valve function is normal      Reviewed results with pt who states understanding.

## 2022-09-28 NOTE — Telephone Encounter (Signed)
Patient called to get test results.

## 2022-09-29 DIAGNOSIS — I714 Abdominal aortic aneurysm, without rupture, unspecified: Secondary | ICD-10-CM | POA: Diagnosis not present

## 2022-09-29 DIAGNOSIS — F324 Major depressive disorder, single episode, in partial remission: Secondary | ICD-10-CM | POA: Diagnosis not present

## 2022-09-29 DIAGNOSIS — R531 Weakness: Secondary | ICD-10-CM | POA: Diagnosis not present

## 2022-09-29 DIAGNOSIS — I129 Hypertensive chronic kidney disease with stage 1 through stage 4 chronic kidney disease, or unspecified chronic kidney disease: Secondary | ICD-10-CM | POA: Diagnosis not present

## 2022-09-29 DIAGNOSIS — J449 Chronic obstructive pulmonary disease, unspecified: Secondary | ICD-10-CM | POA: Diagnosis not present

## 2022-09-29 DIAGNOSIS — R69 Illness, unspecified: Secondary | ICD-10-CM | POA: Diagnosis not present

## 2022-09-29 DIAGNOSIS — E7800 Pure hypercholesterolemia, unspecified: Secondary | ICD-10-CM | POA: Diagnosis not present

## 2022-09-29 DIAGNOSIS — N183 Chronic kidney disease, stage 3 unspecified: Secondary | ICD-10-CM | POA: Diagnosis not present

## 2022-10-05 NOTE — Pre-Procedure Instructions (Signed)
Surgical Instructions    Your procedure is scheduled on October 09, 2022.  Report to [facility] Main Entrance "A" at 5:30 A.M., then check in with the Admitting office.  Call this number if you have problems the morning of surgery:  [phone]  If you have any questions prior to your surgery date call [phone]: Open Monday-Friday 8am-4pm If you experience any cold or flu symptoms such as cough, fever, chills, shortness of breath, etc. between now and your scheduled surgery, please notify us at the above number.     Remember:  Do not eat or drink after midnight the night before your surgery      Take these medicines the morning of surgery with A SIP OF WATER:  ALPRAZolam (XANAX)   amLODipine (NORVASC)   atenolol (TENORMIN)   rosuvastatin (CRESTOR)   umeclidinium-vilanterol (ANORO ELLIPTA) inhaler  venlafaxine XR (EFFEXOR-XR)   traMADol (ULTRAM) - may take if needed   As of today, STOP taking any Aspirin (unless otherwise instructed by your surgeon) Aleve, Naproxen, Ibuprofen, Motrin, Advil, Goody's, BC's, all herbal medications, fish oil, and all vitamins.                     Do NOT Smoke (Tobacco/Vaping) for 24 hours prior to your procedure.  If you use a CPAP at night, you may bring your mask/headgear for your overnight stay.   Contacts, glasses, piercing's, hearing aid's, dentures or partials may not be worn into surgery, please bring cases for these belongings.    For patients admitted to the hospital, discharge time will be determined by your treatment team.   Patients discharged the day of surgery will not be allowed to drive home, and someone needs to stay with them for 24 hours.  SURGICAL WAITING ROOM VISITATION Patients having surgery or a procedure may have no more than 2 support people in the waiting area - these visitors may rotate.   Children under the age of 48 must have an adult with them who is not the patient. If the patient needs to stay at the hospital  during part of their recovery, the visitor guidelines for inpatient rooms apply. Pre-op nurse will coordinate an appropriate time for 1 support person to accompany patient in pre-op.  This support person may not rotate.   Please refer to the [facility] website for the visitor guidelines for Inpatients (after your surgery is over and you are in a regular room).    Special instructions:   [facility]- Preparing For Surgery  Before surgery, you can play an important role. Because skin is not sterile, your skin needs to be as free of germs as possible. You can reduce the number of germs on your skin by washing with CHG (chlorahexidine gluconate) Soap before surgery.  CHG is an antiseptic cleaner which kills germs and bonds with the skin to continue killing germs even after washing.    Oral Hygiene is also important to reduce your risk of infection.  Remember - BRUSH YOUR TEETH THE MORNING OF SURGERY WITH YOUR REGULAR TOOTHPASTE  Please do not use if you have an allergy to CHG or antibacterial soaps. If your skin becomes reddened/irritated stop using the CHG.  Do not shave (including legs and underarms) for at least 48 hours prior to first CHG shower. It is OK to shave your face.  Please follow these instructions carefully.   Shower the NIGHT BEFORE SURGERY and the MORNING OF SURGERY  If you chose to wash your  hair, wash your hair first as usual with your normal shampoo.  After you shampoo, rinse your hair and body thoroughly to remove the shampoo.  Use CHG Soap as you would any other liquid soap. You can apply CHG directly to the skin and wash gently with a scrungie or a clean washcloth.   Apply the CHG Soap to your body ONLY FROM THE NECK DOWN.  Do not use on open wounds or open sores. Avoid contact with your eyes, ears, mouth and genitals (private parts). Wash Face and genitals (private parts)  with your normal soap.   Wash thoroughly, paying special attention to the area where your surgery  will be performed.  Thoroughly rinse your body with warm water from the neck down.  DO NOT shower/wash with your normal soap after using and rinsing off the CHG Soap.  Pat yourself dry with a CLEAN TOWEL.  Wear CLEAN PAJAMAS to bed the night before surgery  Place CLEAN SHEETS on your bed the night before your surgery  DO NOT SLEEP WITH PETS.   Day of Surgery: Take a shower with CHG soap. Do not wear jewelry or makeup Do not wear lotions, powders, perfumes/colognes, or deodorant. Do not shave 48 hours prior to surgery.  Men may shave face and neck. Do not bring valuables to the hospital.  [facility] is not responsible for any belongings or valuables. Do not wear nail polish, gel polish, artificial nails, or any other type of covering on natural nails (fingers and toes) If you have artificial nails or gel coating that need to be removed by a nail salon, please have this removed prior to surgery. Artificial nails or gel coating may interfere with anesthesia's ability to adequately monitor your vital signs.  Wear Clean/Comfortable clothing the morning of surgery Remember to brush your teeth WITH YOUR REGULAR TOOTHPASTE.   Please read over the following fact sheets that you were given.    If you received a COVID test during your pre-op visit  it is requested that you wear a mask when out in public, stay away from anyone that may not be feeling well and notify your surgeon if you develop symptoms. If you have been in contact with anyone that has tested positive in the last 10 days please notify you surgeon.

## 2022-10-06 ENCOUNTER — Inpatient Hospital Stay (HOSPITAL_COMMUNITY)
Admission: RE | Admit: 2022-10-06 | Discharge: 2022-10-06 | Disposition: A | Discharge status: Home / Self Care | Payer: Medicare HMO | Source: Ambulatory Visit

## 2022-10-07 ENCOUNTER — Encounter (HOSPITAL_COMMUNITY): Payer: Self-pay

## 2022-10-07 ENCOUNTER — Other Ambulatory Visit: Payer: Self-pay

## 2022-10-07 ENCOUNTER — Encounter (HOSPITAL_COMMUNITY)
Admission: RE | Admit: 2022-10-07 | Discharge: 2022-10-07 | Disposition: A | Discharge status: Home / Self Care | Payer: Medicare HMO | Source: Ambulatory Visit | Attending: Vascular Surgery | Admitting: Vascular Surgery

## 2022-10-07 VITALS — BP 146/72 | HR 58 | Temp 97.5°F | Resp 18 | Ht 70.0 in | Wt 162.0 lb

## 2022-10-07 DIAGNOSIS — Z01818 Encounter for other preprocedural examination: Secondary | ICD-10-CM

## 2022-10-07 DIAGNOSIS — I7142 Juxtarenal abdominal aortic aneurysm, without rupture: Secondary | ICD-10-CM

## 2022-10-07 HISTORY — DX: Anxiety disorder, unspecified: F41.9

## 2022-10-07 HISTORY — DX: Unspecified osteoarthritis, unspecified site: M19.90

## 2022-10-07 HISTORY — DX: Dyspnea, unspecified: R06.00

## 2022-10-07 LAB — APTT: aPTT: 31 seconds (ref 24–36)

## 2022-10-07 LAB — URINALYSIS, ROUTINE W REFLEX MICROSCOPIC
Bacteria, UA: NONE SEEN
Bilirubin Urine: NEGATIVE
Glucose, UA: NEGATIVE mg/dL
Ketones, ur: NEGATIVE mg/dL
Leukocytes,Ua: NEGATIVE
Nitrite: NEGATIVE
Protein, ur: NEGATIVE mg/dL
Specific Gravity, Urine: 1.005 (ref 1.005–1.030)
pH: 6 (ref 5.0–8.0)

## 2022-10-07 LAB — COMPREHENSIVE METABOLIC PANEL
ALT: 18 U/L (ref 0–44)
AST: 19 U/L (ref 15–41)
Albumin: 4.1 g/dL (ref 3.5–5.0)
Alkaline Phosphatase: 149 U/L — ABNORMAL HIGH (ref 38–126)
Anion gap: 13 (ref 5–15)
BUN: 12 mg/dL (ref 8–23)
CO2: 21 mmol/L — ABNORMAL LOW (ref 22–32)
Calcium: 10 mg/dL (ref 8.9–10.3)
Chloride: 101 mmol/L (ref 98–111)
Creatinine, Ser: 1.13 mg/dL — ABNORMAL HIGH (ref 0.44–1.00)
GFR, Estimated: 52 mL/min — ABNORMAL LOW (ref >60)
Glucose, Bld: 82 mg/dL (ref 70–99)
Potassium: 4.1 mmol/L (ref 3.5–5.1)
Sodium: 135 mmol/L (ref 135–145)
Total Bilirubin: 0.3 mg/dL (ref 0.3–1.2)
Total Protein: 7.2 g/dL (ref 6.5–8.1)

## 2022-10-07 LAB — CBC
HCT: 47.6 % — ABNORMAL HIGH (ref 36.0–46.0)
Hemoglobin: 15.3 g/dL — ABNORMAL HIGH (ref 12.0–15.0)
MCH: 28.3 pg (ref 26.0–34.0)
MCHC: 32.1 g/dL (ref 30.0–36.0)
MCV: 88 fL (ref 80.0–100.0)
Platelets: 309 10*3/uL (ref 150–400)
RBC: 5.41 MIL/uL — ABNORMAL HIGH (ref 3.87–5.11)
RDW: 12.2 % (ref 11.5–15.5)
WBC: 9 10*3/uL (ref 4.0–10.5)
nRBC: 0 % (ref 0.0–0.2)

## 2022-10-07 LAB — TYPE AND SCREEN
ABO/RH(D): B POS
Antibody Screen: NEGATIVE

## 2022-10-07 LAB — PROTIME-INR
INR: 1 (ref 0.8–1.2)
Prothrombin Time: 13.1 seconds (ref 11.4–15.2)

## 2022-10-07 LAB — SURGICAL PCR SCREEN
MRSA, PCR: NEGATIVE
Staphylococcus aureus: NEGATIVE

## 2022-10-07 NOTE — Progress Notes (Signed)
PCP -  Rinka R. Doristine Bosworth, MD Cardiologist - Dorris Carnes, MD  PPM/ICD - Denies  Chest x-ray - 09/02/2022 EKG - 09/02/2022 Stress Test - 09/10/2022 ECHO - 09/24/2022 Cardiac Cath - Denies  Sleep Study - Denies  DM: Denies  Blood Thinner Instructions: N/A Aspirin Instructions:  ERAS Protcol - PRE-SURGERY Ensure or G2-   COVID TEST- N/A   Anesthesia review: Yes, cardiac clearance appointment non 08/07/2022, however there is not actual clearance note after testing was completed. Per patient, cardiologist verbally told patient she was clear for surgery.  Patient denies shortness of breath, fever, cough and chest pain at PAT appointment   All instructions explained to the patient, with a verbal understanding of the material. Patient agrees to go over the instructions while at home for a better understanding. Patient also instructed to self quarantine after being tested for COVID-19. The opportunity to ask questions was provided.

## 2022-10-08 NOTE — Progress Notes (Signed)
Anesthesia Chart Review:  Follows cardiology for history of HTN, HLD, DOE.  She was found to have 5.9 cm AAA with plan for endovascular repair and was seen by Dr. Harrington Challenger 08/07/2022 for preoperative restratification.  Lexiscan Myoview was ordered to rule out ischemia.  Test done on 09/10/2022 showed normal perfusion, low risk. Dr. Harrington Challenger commented on result stating, "From cardiac standpoint, pt is lower risk for major cardiac event in periop period  Keep on telemetry."  Subsequent echo 09/24/22 showed EF 60 to 65%, grade 1 DD, normal valves.  Current smoker with associated severe COPD, maintained on an oral Ellipta.  Preop labs reviewed, creatinine mildly elevated 1.13, otherwise unremarkable.  EKG 09/02/2022: Sinus rhythm with premature atrial complexes.  Rate 66.  Possible LAE.  Left axis deviation.  PFTs 09/10/2021: FVC-%Pred-Pre % 61  FEV1-%Pred-Pre % 47  FEV1FVC-%Pred-Pre % 78  DLCO unc % pred % 51   TTE 09/24/2022:  1. Left ventricular ejection fraction, by estimation, is 60 to 65%. The  left ventricle has normal function. The left ventricle has no regional  wall motion abnormalities. There is mild left ventricular hypertrophy.  Left ventricular diastolic parameters  are consistent with Grade I diastolic dysfunction (impaired relaxation).   2. Right ventricular systolic function is normal. The right ventricular  size is normal.   3. The mitral valve is normal in structure. Trivial mitral valve  regurgitation. No evidence of mitral stenosis.   4. The aortic valve is tricuspid. Aortic valve regurgitation is trivial.  Aortic valve sclerosis is present, with no evidence of aortic valve  stenosis.   5. The inferior vena cava is normal in size with greater than 50%  respiratory variability, suggesting right atrial pressure of 3 mmHg.   Nuclear stress 09/10/2022:   The study is normal. The study is low risk.   No ST deviation was noted.   LV perfusion is normal.   Left ventricular function is  normal. Nuclear stress EF: 68 %. The left ventricular ejection fraction is hyperdynamic (>65%). End diastolic cavity size is normal. End systolic cavity size is normal.   Prior study not available for comparison.   Normal perfusion. LVEF 68% with normal wall motion. This is a low risk study. No prior for comparison.    [facility] Stay [facility]/Anesthesiology Phone [phone] 10/08/2022 8:48 AM

## 2022-10-08 NOTE — Anesthesia Preprocedure Evaluation (Addendum)
Anesthesia Evaluation  Patient identified by MRN, date of birth, ID band Patient awake    Reviewed: Allergy & Precautions, NPO status , Patient's Chart, lab work & pertinent test results  History of Anesthesia Complications Negative for: history of anesthetic complications  Airway Mallampati: III  TM Distance: >3 FB Neck ROM: Full    Dental  (+) Dental Advisory Given, Teeth Intact   Pulmonary shortness of breath, COPD,  COPD inhaler, Current Smoker   + rhonchi        Cardiovascular hypertension, Pt. on medications and Pt. on home beta blockers (-) angina (-) Past MI and (-) CHF  Rhythm:Regular  1. Left ventricular ejection fraction, by estimation, is 60 to 65%. The  left ventricle has normal function. The left ventricle has no regional  wall motion abnormalities. There is mild left ventricular hypertrophy.  Left ventricular diastolic parameters  are consistent with Grade I diastolic dysfunction (impaired relaxation).   2. Right ventricular systolic function is normal. The right ventricular  size is normal.   3. The mitral valve is normal in structure. Trivial mitral valve  regurgitation. No evidence of mitral stenosis.   4. The aortic valve is tricuspid. Aortic valve regurgitation is trivial.  Aortic valve sclerosis is present, with no evidence of aortic valve  stenosis.   5. The inferior vena cava is normal in size with greater than 50%  respiratory variability, suggesting right atrial pressure of 3 mmHg.      The study is normal. The study is low risk.   No ST deviation was noted.   LV perfusion is normal.   Left ventricular function is normal. Nuclear stress EF: 68 %. The left ventricular ejection fraction is hyperdynamic (>65%). End diastolic cavity size is normal. End systolic cavity size is normal.   Prior study not available for comparison.   Normal perfusion. LVEF 68% with normal wall motion. This is a low risk  study. No prior for comparison.       Neuro/Psych  PSYCHIATRIC DISORDERS Anxiety Depression    negative neurological ROS     GI/Hepatic negative GI ROS, Neg liver ROS,,,  Endo/Other  negative endocrine ROS    Renal/GU Renal diseaseLab Results      Component                Value               Date                      CREATININE               1.13 (H)            10/07/2022                Musculoskeletal  (+) Arthritis ,    Abdominal   Peds  Hematology negative hematology ROS (+) Lab Results      Component                Value               Date                      WBC                      9.0                 10/07/2022  HGB                      15.3 (H)            10/07/2022                HCT                      47.6 (H)            10/07/2022                MCV                      88.0                10/07/2022                PLT                      309                 10/07/2022              Anesthesia Other Findings   Reproductive/Obstetrics                              Anesthesia Physical Anesthesia Plan  ASA: 3  Anesthesia Plan: MAC and Spinal   Post-op Pain Management: Minimal or no pain anticipated   Induction: Intravenous  PONV Risk Score and Plan: 1 and Ondansetron and Propofol infusion  Airway Management Planned: Nasal Cannula and Natural Airway  Additional Equipment: Arterial line  Intra-op Plan:   Post-operative Plan:   Informed Consent: I have reviewed the patients History and Physical, chart, labs and discussed the procedure including the risks, benefits and alternatives for the proposed anesthesia with the patient or authorized representative who has indicated his/her understanding and acceptance.     Dental advisory given  Plan Discussed with: CRNA  Anesthesia Plan Comments: (PAT note by Karoline Caldwell, PA-C:  [facility] cardiology for history of HTN, HLD, DOE.  She was found to have 5.9 cm AAA  with plan for endovascular repair and was seen by Dr. Harrington Challenger 08/07/2022 for preoperative restratification.  Lexiscan Myoview was ordered to rule out ischemia.  Test done on 09/10/2022 showed normal perfusion, low risk. Dr. Harrington Challenger commented on result stating, "From cardiac standpoint, pt is lower risk for major cardiac event in periop period  Keep on telemetry."  Subsequent echo 09/24/22 showed EF 60 to 65%, grade 1 DD, normal valves.  Current smoker with associated severe COPD, maintained on an oral Ellipta.  Preop labs reviewed, creatinine mildly elevated 1.13, otherwise unremarkable.  EKG 09/02/2022: Sinus rhythm with premature atrial complexes.  Rate 66.  Possible LAE.  Left axis deviation.  PFTs 09/10/2021: FVC-%Pred-Pre % 61 FEV1-%Pred-Pre % 47 FEV1FVC-%Pred-Pre % 78 DLCO unc % pred % 51  TTE 09/24/2022:  1. Left ventricular ejection fraction, by estimation, is 60 to 65%. The  left ventricle has normal function. The left ventricle has no regional  wall motion abnormalities. There is mild left ventricular hypertrophy.  Left ventricular diastolic parameters  are consistent with Grade I diastolic dysfunction (impaired relaxation).   2. Right ventricular systolic function is normal. The right ventricular  size is normal.   3. The mitral valve is normal in structure. Trivial mitral valve  regurgitation. No evidence of mitral stenosis.   4. The aortic valve is tricuspid. Aortic valve regurgitation is trivial.  Aortic valve sclerosis is present, with no evidence of aortic valve  stenosis.   5. The inferior vena cava is normal in size with greater than 50%  respiratory variability, suggesting right atrial pressure of 3 mmHg.   Nuclear stress 09/10/2022:   The study is normal. The study is low risk.   No ST deviation was noted.   LV perfusion is normal.   Left ventricular function is normal. Nuclear stress EF: 68 %. The left ventricular ejection fraction is hyperdynamic (>65%). End diastolic  cavity size is normal. End systolic cavity size is normal.   Prior study not available for comparison.   Normal perfusion. LVEF 68% with normal wall motion. This is a low risk study. No prior for comparison.   )         Anesthesia Quick Evaluation

## 2022-10-09 ENCOUNTER — Inpatient Hospital Stay (HOSPITAL_COMMUNITY): Payer: Medicare HMO | Admitting: Certified Registered"

## 2022-10-09 ENCOUNTER — Encounter (HOSPITAL_COMMUNITY): Payer: Self-pay | Admitting: Vascular Surgery

## 2022-10-09 ENCOUNTER — Other Ambulatory Visit: Payer: Self-pay

## 2022-10-09 ENCOUNTER — Inpatient Hospital Stay (HOSPITAL_COMMUNITY)
Admission: RE | Admit: 2022-10-09 | Discharge: 2022-10-10 | DRG: 269 | Disposition: A | Discharge status: Home / Self Care | Payer: Medicare HMO | Attending: Vascular Surgery | Admitting: Vascular Surgery

## 2022-10-09 ENCOUNTER — Inpatient Hospital Stay (HOSPITAL_COMMUNITY): Payer: Medicare HMO | Admitting: Physician Assistant

## 2022-10-09 ENCOUNTER — Inpatient Hospital Stay (HOSPITAL_COMMUNITY): Payer: Medicare HMO

## 2022-10-09 ENCOUNTER — Encounter (HOSPITAL_COMMUNITY)
Admission: RE | Disposition: A | Discharge status: Home / Self Care | Payer: Self-pay | Source: Home / Self Care | Attending: Vascular Surgery

## 2022-10-09 DIAGNOSIS — Z8701 Personal history of pneumonia (recurrent): Secondary | ICD-10-CM | POA: Diagnosis not present

## 2022-10-09 DIAGNOSIS — E785 Hyperlipidemia, unspecified: Secondary | ICD-10-CM | POA: Diagnosis present

## 2022-10-09 DIAGNOSIS — I714 Abdominal aortic aneurysm, without rupture, unspecified: Secondary | ICD-10-CM | POA: Diagnosis not present

## 2022-10-09 DIAGNOSIS — Z880 Allergy status to penicillin: Secondary | ICD-10-CM

## 2022-10-09 DIAGNOSIS — I129 Hypertensive chronic kidney disease with stage 1 through stage 4 chronic kidney disease, or unspecified chronic kidney disease: Secondary | ICD-10-CM | POA: Diagnosis not present

## 2022-10-09 DIAGNOSIS — F17210 Nicotine dependence, cigarettes, uncomplicated: Secondary | ICD-10-CM | POA: Diagnosis present

## 2022-10-09 DIAGNOSIS — J449 Chronic obstructive pulmonary disease, unspecified: Secondary | ICD-10-CM | POA: Diagnosis not present

## 2022-10-09 DIAGNOSIS — G8929 Other chronic pain: Secondary | ICD-10-CM | POA: Diagnosis not present

## 2022-10-09 DIAGNOSIS — Z888 Allergy status to other drugs, medicaments and biological substances status: Secondary | ICD-10-CM | POA: Diagnosis not present

## 2022-10-09 DIAGNOSIS — N189 Chronic kidney disease, unspecified: Secondary | ICD-10-CM | POA: Diagnosis present

## 2022-10-09 DIAGNOSIS — I10 Essential (primary) hypertension: Secondary | ICD-10-CM

## 2022-10-09 DIAGNOSIS — M549 Dorsalgia, unspecified: Secondary | ICD-10-CM | POA: Diagnosis not present

## 2022-10-09 DIAGNOSIS — Z79899 Other long term (current) drug therapy: Secondary | ICD-10-CM

## 2022-10-09 DIAGNOSIS — J9811 Atelectasis: Secondary | ICD-10-CM | POA: Diagnosis not present

## 2022-10-09 DIAGNOSIS — Z7951 Long term (current) use of inhaled steroids: Secondary | ICD-10-CM

## 2022-10-09 DIAGNOSIS — R69 Illness, unspecified: Secondary | ICD-10-CM | POA: Diagnosis not present

## 2022-10-09 DIAGNOSIS — R109 Unspecified abdominal pain: Secondary | ICD-10-CM | POA: Diagnosis not present

## 2022-10-09 HISTORY — PX: ABDOMINAL AORTIC ENDOVASCULAR STENT GRAFT: SHX5707

## 2022-10-09 HISTORY — PX: ULTRASOUND GUIDANCE FOR VASCULAR ACCESS: SHX6516

## 2022-10-09 LAB — BASIC METABOLIC PANEL
Anion gap: 7 (ref 5–15)
BUN: 13 mg/dL (ref 8–23)
CO2: 22 mmol/L (ref 22–32)
Calcium: 8.8 mg/dL — ABNORMAL LOW (ref 8.9–10.3)
Chloride: 104 mmol/L (ref 98–111)
Creatinine, Ser: 1.05 mg/dL — ABNORMAL HIGH (ref 0.44–1.00)
GFR, Estimated: 56 mL/min — ABNORMAL LOW (ref >60)
Glucose, Bld: 103 mg/dL — ABNORMAL HIGH (ref 70–99)
Potassium: 4.4 mmol/L (ref 3.5–5.1)
Sodium: 133 mmol/L — ABNORMAL LOW (ref 135–145)

## 2022-10-09 LAB — APTT: aPTT: 34 seconds (ref 24–36)

## 2022-10-09 LAB — PROTIME-INR
INR: 1.1 (ref 0.8–1.2)
Prothrombin Time: 14.3 seconds (ref 11.4–15.2)

## 2022-10-09 LAB — CBC
HCT: 43 % (ref 36.0–46.0)
Hemoglobin: 14 g/dL (ref 12.0–15.0)
MCH: 28.6 pg (ref 26.0–34.0)
MCHC: 32.6 g/dL (ref 30.0–36.0)
MCV: 87.9 fL (ref 80.0–100.0)
Platelets: 210 10*3/uL (ref 150–400)
RBC: 4.89 MIL/uL (ref 3.87–5.11)
RDW: 12.2 % (ref 11.5–15.5)
WBC: 8.2 10*3/uL (ref 4.0–10.5)
nRBC: 0 % (ref 0.0–0.2)

## 2022-10-09 LAB — ABO/RH: ABO/RH(D): B POS

## 2022-10-09 LAB — POCT ACTIVATED CLOTTING TIME: Activated Clotting Time: 287 seconds

## 2022-10-09 LAB — MAGNESIUM: Magnesium: 2 mg/dL (ref 1.7–2.4)

## 2022-10-09 SURGERY — INSERTION, ENDOVASCULAR STENT GRAFT, AORTA, ABDOMINAL
Anesthesia: Monitor Anesthesia Care | Site: Groin

## 2022-10-09 MED ORDER — ASPIRIN 81 MG PO TBEC
81.0000 mg | DELAYED_RELEASE_TABLET | Freq: Every day | ORAL | Status: DC
  Administered 2022-10-10: 81 mg via ORAL
  Filled 2022-10-09: qty 1

## 2022-10-09 MED ORDER — FENTANYL CITRATE (PF) 250 MCG/5ML IJ SOLN
INTRAMUSCULAR | Status: DC | PRN
  Administered 2022-10-09: 50 ug via INTRAVENOUS

## 2022-10-09 MED ORDER — ATENOLOL 100 MG PO TABS
100.0000 mg | ORAL_TABLET | Freq: Every day | ORAL | Status: DC
  Administered 2022-10-10: 100 mg via ORAL
  Filled 2022-10-09: qty 1

## 2022-10-09 MED ORDER — POLYETHYLENE GLYCOL 3350 17 G PO PACK: 17.0000 g | PACK | Freq: Every day | ORAL | Status: DC | PRN

## 2022-10-09 MED ORDER — UMECLIDINIUM-VILANTEROL 62.5-25 MCG/ACT IN AEPB: 1.0000 | INHALATION_SPRAY | Freq: Every day | RESPIRATORY_TRACT | Status: DC

## 2022-10-09 MED ORDER — GUAIFENESIN-DM 100-10 MG/5ML PO SYRP: 15.0000 mL | ORAL_SOLUTION | ORAL | Status: DC | PRN

## 2022-10-09 MED ORDER — VANCOMYCIN HCL IN DEXTROSE 1-5 GM/200ML-% IV SOLN: 1000.0000 mg | INTRAVENOUS | Status: AC

## 2022-10-09 MED ORDER — FENTANYL CITRATE (PF) 100 MCG/2ML IJ SOLN
25.0000 ug | INTRAMUSCULAR | Status: DC | PRN
  Administered 2022-10-09: 25 ug via INTRAVENOUS

## 2022-10-09 MED ORDER — VANCOMYCIN HCL IN DEXTROSE 1-5 GM/200ML-% IV SOLN
1000.0000 mg | Freq: Two times a day (BID) | INTRAVENOUS | Status: AC
  Administered 2022-10-09 – 2022-10-10 (×2): 1000 mg via INTRAVENOUS
  Filled 2022-10-09 (×2): qty 200

## 2022-10-09 MED ORDER — POTASSIUM CHLORIDE CRYS ER 20 MEQ PO TBCR: 20.0000 meq | EXTENDED_RELEASE_TABLET | Freq: Every day | ORAL | Status: DC | PRN

## 2022-10-09 MED ORDER — BISACODYL 10 MG RE SUPP: 10.0000 mg | Freq: Every day | RECTAL | Status: DC | PRN

## 2022-10-09 MED ORDER — PROTAMINE SULFATE 10 MG/ML IV SOLN
INTRAVENOUS | Status: DC | PRN
  Administered 2022-10-09: 10 mg via INTRAVENOUS
  Administered 2022-10-09: 40 mg via INTRAVENOUS

## 2022-10-09 MED ORDER — ACETAMINOPHEN 10 MG/ML IV SOLN: 1000.0000 mg | Freq: Once | INTRAVENOUS | Status: DC | PRN

## 2022-10-09 MED ORDER — LACTATED RINGERS IV SOLN: INTRAVENOUS | Status: DC

## 2022-10-09 MED ORDER — CHLORHEXIDINE GLUCONATE 0.12 % MT SOLN
OROMUCOSAL | Status: AC
  Administered 2022-10-09: 15 mL via OROMUCOSAL
  Filled 2022-10-09: qty 15

## 2022-10-09 MED ORDER — FENTANYL CITRATE (PF) 100 MCG/2ML IJ SOLN
INTRAMUSCULAR | Status: AC
  Filled 2022-10-09: qty 2

## 2022-10-09 MED ORDER — MAGNESIUM SULFATE 2 GM/50ML IV SOLN: 2.0000 g | Freq: Every day | INTRAVENOUS | Status: DC | PRN

## 2022-10-09 MED ORDER — HEPARIN SODIUM (PORCINE) 1000 UNIT/ML IJ SOLN
INTRAMUSCULAR | Status: DC | PRN
  Administered 2022-10-09: 8000 [IU] via INTRAVENOUS

## 2022-10-09 MED ORDER — ROSUVASTATIN CALCIUM 5 MG PO TABS
5.0000 mg | ORAL_TABLET | Freq: Every day | ORAL | Status: DC
  Administered 2022-10-10: 5 mg via ORAL
  Filled 2022-10-09: qty 1

## 2022-10-09 MED ORDER — HYDRALAZINE HCL 20 MG/ML IJ SOLN
INTRAMUSCULAR | Status: AC
  Filled 2022-10-09: qty 1

## 2022-10-09 MED ORDER — PANTOPRAZOLE SODIUM 40 MG PO TBEC
40.0000 mg | DELAYED_RELEASE_TABLET | Freq: Every day | ORAL | Status: DC
  Administered 2022-10-10: 40 mg via ORAL
  Filled 2022-10-09: qty 1

## 2022-10-09 MED ORDER — AMLODIPINE BESYLATE 5 MG PO TABS
5.0000 mg | ORAL_TABLET | Freq: Two times a day (BID) | ORAL | Status: DC
  Administered 2022-10-09 – 2022-10-10 (×2): 5 mg via ORAL
  Filled 2022-10-09 (×2): qty 1

## 2022-10-09 MED ORDER — PHENOL 1.4 % MT LIQD: 1.0000 | OROMUCOSAL | Status: DC | PRN

## 2022-10-09 MED ORDER — ONDANSETRON HCL 4 MG/2ML IJ SOLN
INTRAMUSCULAR | Status: DC | PRN
  Administered 2022-10-09: 4 mg via INTRAVENOUS

## 2022-10-09 MED ORDER — ACETAMINOPHEN 500 MG PO TABS: 1000.0000 mg | ORAL_TABLET | Freq: Once | ORAL | Status: DC | PRN

## 2022-10-09 MED ORDER — DOCUSATE SODIUM 100 MG PO CAPS
100.0000 mg | ORAL_CAPSULE | Freq: Every day | ORAL | Status: DC
  Administered 2022-10-10: 100 mg via ORAL
  Filled 2022-10-09: qty 1

## 2022-10-09 MED ORDER — HEPARIN 6000 UNIT IRRIGATION SOLUTION
Status: DC | PRN
  Administered 2022-10-09: 1

## 2022-10-09 MED ORDER — HYDRALAZINE HCL 20 MG/ML IJ SOLN
5.0000 mg | INTRAMUSCULAR | Status: DC | PRN
  Administered 2022-10-09: 5 mg via INTRAVENOUS

## 2022-10-09 MED ORDER — LABETALOL HCL 5 MG/ML IV SOLN: 10.0000 mg | INTRAVENOUS | Status: DC | PRN

## 2022-10-09 MED ORDER — HYDROMORPHONE HCL 1 MG/ML IJ SOLN
0.5000 mg | INTRAMUSCULAR | Status: DC | PRN
  Administered 2022-10-09 (×2): 0.5 mg via INTRAVENOUS
  Filled 2022-10-09 (×2): qty 0.5

## 2022-10-09 MED ORDER — ONDANSETRON HCL 4 MG/2ML IJ SOLN: 4.0000 mg | Freq: Once | INTRAMUSCULAR | Status: DC | PRN

## 2022-10-09 MED ORDER — ORAL CARE MOUTH RINSE: 15.0000 mL | Freq: Once | OROMUCOSAL | Status: AC

## 2022-10-09 MED ORDER — SODIUM CHLORIDE 0.9 % IV SOLN: INTRAVENOUS | Status: DC

## 2022-10-09 MED ORDER — ONDANSETRON HCL 4 MG/2ML IJ SOLN
4.0000 mg | Freq: Once | INTRAMUSCULAR | Status: AC
  Administered 2022-10-09: 4 mg via INTRAVENOUS

## 2022-10-09 MED ORDER — HEPARIN 6000 UNIT IRRIGATION SOLUTION
Status: AC
  Filled 2022-10-09: qty 500

## 2022-10-09 MED ORDER — IODIXANOL 320 MG/ML IV SOLN
INTRAVENOUS | Status: DC | PRN
  Administered 2022-10-09: 125 mL via INTRA_ARTERIAL

## 2022-10-09 MED ORDER — SODIUM CHLORIDE 0.9 % IV SOLN: 500.0000 mL | Freq: Once | INTRAVENOUS | Status: DC | PRN

## 2022-10-09 MED ORDER — PROPOFOL 10 MG/ML IV BOLUS
INTRAVENOUS | Status: AC
  Filled 2022-10-09: qty 20

## 2022-10-09 MED ORDER — HEPARIN SODIUM (PORCINE) 5000 UNIT/ML IJ SOLN
5000.0000 [IU] | Freq: Three times a day (TID) | INTRAMUSCULAR | Status: DC
  Administered 2022-10-10: 5000 [IU] via SUBCUTANEOUS
  Filled 2022-10-09: qty 1

## 2022-10-09 MED ORDER — ACETAMINOPHEN 160 MG/5ML PO SOLN: 1000.0000 mg | Freq: Once | ORAL | Status: DC | PRN

## 2022-10-09 MED ORDER — ONDANSETRON HCL 4 MG/2ML IJ SOLN: 4.0000 mg | Freq: Four times a day (QID) | INTRAMUSCULAR | Status: DC | PRN

## 2022-10-09 MED ORDER — ALUM & MAG HYDROXIDE-SIMETH 200-200-20 MG/5ML PO SUSP: 15.0000 mL | ORAL | Status: DC | PRN

## 2022-10-09 MED ORDER — DEXAMETHASONE SODIUM PHOSPHATE 10 MG/ML IJ SOLN
INTRAMUSCULAR | Status: DC | PRN
  Administered 2022-10-09: 10 mg via INTRAVENOUS

## 2022-10-09 MED ORDER — LACTATED RINGERS IV SOLN: INTRAVENOUS | Status: DC | PRN

## 2022-10-09 MED ORDER — CHLORHEXIDINE GLUCONATE CLOTH 2 % EX PADS: 6.0000 | MEDICATED_PAD | Freq: Once | CUTANEOUS | Status: DC

## 2022-10-09 MED ORDER — NICOTINE 21 MG/24HR TD PT24
21.0000 mg | MEDICATED_PATCH | Freq: Every day | TRANSDERMAL | Status: DC
  Administered 2022-10-09 – 2022-10-10 (×2): 21 mg via TRANSDERMAL
  Filled 2022-10-09 (×2): qty 1

## 2022-10-09 MED ORDER — PROPOFOL 500 MG/50ML IV EMUL
INTRAVENOUS | Status: DC | PRN
  Administered 2022-10-09: 75 ug/kg/min via INTRAVENOUS

## 2022-10-09 MED ORDER — BENAZEPRIL HCL 20 MG PO TABS
40.0000 mg | ORAL_TABLET | Freq: Every day | ORAL | Status: DC
  Administered 2022-10-10: 40 mg via ORAL
  Filled 2022-10-09 (×2): qty 2

## 2022-10-09 MED ORDER — FENTANYL CITRATE (PF) 250 MCG/5ML IJ SOLN
INTRAMUSCULAR | Status: AC
  Filled 2022-10-09: qty 5

## 2022-10-09 MED ORDER — CHLORHEXIDINE GLUCONATE 0.12 % MT SOLN: 15.0000 mL | Freq: Once | OROMUCOSAL | Status: AC

## 2022-10-09 MED ORDER — PHENYLEPHRINE HCL-NACL 20-0.9 MG/250ML-% IV SOLN
INTRAVENOUS | Status: DC | PRN
  Administered 2022-10-09: 20 ug/min via INTRAVENOUS

## 2022-10-09 MED ORDER — METOPROLOL TARTRATE 5 MG/5ML IV SOLN: 2.0000 mg | INTRAVENOUS | Status: DC | PRN

## 2022-10-09 MED ORDER — TRAMADOL HCL 50 MG PO TABS
50.0000 mg | ORAL_TABLET | Freq: Four times a day (QID) | ORAL | Status: DC | PRN
  Administered 2022-10-10: 50 mg via ORAL
  Filled 2022-10-09: qty 1

## 2022-10-09 MED ORDER — UMECLIDINIUM-VILANTEROL 62.5-25 MCG/ACT IN AEPB
1.0000 | INHALATION_SPRAY | Freq: Every day | RESPIRATORY_TRACT | Status: DC
  Filled 2022-10-09: qty 14

## 2022-10-09 MED ORDER — ALPRAZOLAM 0.5 MG PO TABS
1.0000 mg | ORAL_TABLET | Freq: Three times a day (TID) | ORAL | Status: DC
  Administered 2022-10-09 – 2022-10-10 (×3): 1 mg via ORAL
  Filled 2022-10-09 (×3): qty 2

## 2022-10-09 MED ORDER — CLOPIDOGREL BISULFATE 75 MG PO TABS
75.0000 mg | ORAL_TABLET | Freq: Every day | ORAL | Status: DC
  Administered 2022-10-10: 75 mg via ORAL
  Filled 2022-10-09: qty 1

## 2022-10-09 MED ORDER — ACETAMINOPHEN 325 MG RE SUPP: 325.0000 mg | RECTAL | Status: DC | PRN

## 2022-10-09 MED ORDER — ONDANSETRON HCL 4 MG/2ML IJ SOLN
INTRAMUSCULAR | Status: AC
  Filled 2022-10-09: qty 2

## 2022-10-09 MED ORDER — VANCOMYCIN HCL IN DEXTROSE 1-5 GM/200ML-% IV SOLN
INTRAVENOUS | Status: AC
  Administered 2022-10-09: 1000 mg via INTRAVENOUS
  Filled 2022-10-09: qty 200

## 2022-10-09 MED ORDER — ACETAMINOPHEN 325 MG PO TABS: 325.0000 mg | ORAL_TABLET | ORAL | Status: DC | PRN

## 2022-10-09 MED ORDER — 0.9 % SODIUM CHLORIDE (POUR BTL) OPTIME
TOPICAL | Status: DC | PRN
  Administered 2022-10-09: 1000 mL

## 2022-10-09 MED ORDER — VENLAFAXINE HCL ER 150 MG PO CP24
150.0000 mg | ORAL_CAPSULE | Freq: Every day | ORAL | Status: DC
  Administered 2022-10-10: 150 mg via ORAL
  Filled 2022-10-09: qty 1

## 2022-10-09 MED ORDER — BUPIVACAINE HCL (PF) 0.5 % IJ SOLN
INTRAMUSCULAR | Status: DC | PRN
  Administered 2022-10-09: 3 mL via INTRATHECAL

## 2022-10-09 SURGICAL SUPPLY — 60 items
ADH SKN CLS APL DERMABOND .7 (GAUZE/BANDAGES/DRESSINGS) ×2
ADH SKN CLS LQ APL DERMABOND (GAUZE/BANDAGES/DRESSINGS) ×2
ANCHOR CATH FOLEY SECURE (MISCELLANEOUS) IMPLANT
BAG COUNTER SPONGE SURGICOUNT (BAG) ×2 IMPLANT
BAG SPNG CNTER NS LX DISP (BAG) ×2
BLADE CLIPPER SURG (BLADE) ×2 IMPLANT
CANISTER SUCT 3000ML PPV (MISCELLANEOUS) ×2 IMPLANT
CATH BEACON 5.038 65CM KMP-01 (CATHETERS) ×2 IMPLANT
CATH OMNI FLUSH .035X70CM (CATHETERS) ×2 IMPLANT
DERMABOND ADVANCED .7 DNX12 (GAUZE/BANDAGES/DRESSINGS) ×2 IMPLANT
DERMABOND ADVANCED .7 DNX6 (GAUZE/BANDAGES/DRESSINGS) IMPLANT
DEVICE CLOSURE PERCLS PRGLD 6F (VASCULAR PRODUCTS) ×8 IMPLANT
DRSG TEGADERM 2-3/8X2-3/4 SM (GAUZE/BANDAGES/DRESSINGS) ×4 IMPLANT
ELECT REM PT RETURN 9FT ADLT (ELECTROSURGICAL) ×2
ELECTRODE REM PT RTRN 9FT ADLT (ELECTROSURGICAL) ×4 IMPLANT
EXCLDR TRNK 28.5X14.5X12 16F (Endovascular Graft) ×2 IMPLANT
EXCLUDER TNK 28.5X14.5X12 16F (Endovascular Graft) IMPLANT
GAUZE 4X4 16PLY ~~LOC~~+RFID DBL (SPONGE) ×2 IMPLANT
GAUZE SPONGE 2X2 8PLY STRL LF (GAUZE/BANDAGES/DRESSINGS) ×4 IMPLANT
GLIDEWIRE ADV .035X180CM (WIRE) ×2 IMPLANT
GLIDEWIRE NITREX 0.018X80X5 (WIRE) ×2
GLOVE BIO SURGEON STRL SZ7.5 (GLOVE) ×2 IMPLANT
GOWN STRL REUS W/ TWL LRG LVL3 (GOWN DISPOSABLE) ×4 IMPLANT
GOWN STRL REUS W/ TWL XL LVL3 (GOWN DISPOSABLE) ×4 IMPLANT
GOWN STRL REUS W/TWL LRG LVL3 (GOWN DISPOSABLE) ×4
GOWN STRL REUS W/TWL XL LVL3 (GOWN DISPOSABLE) ×4
GRAFT BALLN CATH 65CM (STENTS) ×2 IMPLANT
GUIDEWIRE NITREX 0.018X80X5 (WIRE) IMPLANT
KIT BASIN OR (CUSTOM PROCEDURE TRAY) ×2 IMPLANT
KIT ENCORE 26 ADVANTAGE (KITS) IMPLANT
KIT TURNOVER KIT B (KITS) ×2 IMPLANT
LEG CONTRALATERAL 16X12X12 (Vascular Products) IMPLANT
LEG CONTRALATERAL 16X12X14 (Vascular Products) ×2 IMPLANT
NS IRRIG 1000ML POUR BTL (IV SOLUTION) ×2 IMPLANT
PACK ENDOVASCULAR (PACKS) ×2 IMPLANT
PAD ARMBOARD 7.5X6 YLW CONV (MISCELLANEOUS) ×4 IMPLANT
PERCLOSE PROGLIDE 6F (VASCULAR PRODUCTS) ×8
POWDER SURGICEL 3.0 GRAM (HEMOSTASIS) IMPLANT
SET MICROPUNCTURE 5F STIFF (MISCELLANEOUS) ×2 IMPLANT
SHEATH BRITE TIP 8FR 23CM (SHEATH) ×2 IMPLANT
SHEATH DRYSEAL FLEX 12FR 33CM (SHEATH) IMPLANT
SHEATH DRYSEAL FLEX 16FR 33CM (SHEATH) IMPLANT
SHEATH PINNACLE 8F 10CM (SHEATH) ×2 IMPLANT
SPONGE T-LAP 18X18 ~~LOC~~+RFID (SPONGE) ×4 IMPLANT
STENT EXPRESS LD 10X37X75 (Permanent Stent) IMPLANT
STENT GRAFT BALLN CATH 65CM (STENTS) ×2
STENT GRAFT CONTRALAT 16X12X14 (Vascular Products) IMPLANT
STENT VIABAHN 9X29X80 VBX (Permanent Stent) IMPLANT
STOPCOCK MORSE 400PSI 3WAY (MISCELLANEOUS) ×2 IMPLANT
SUT MNCRL AB 4-0 PS2 18 (SUTURE) ×4 IMPLANT
SUT VIC AB 2-0 CT1 27 (SUTURE)
SUT VIC AB 2-0 CT1 TAPERPNT 27 (SUTURE) IMPLANT
SUT VIC AB 3-0 SH 27 (SUTURE)
SUT VIC AB 3-0 SH 27X BRD (SUTURE) IMPLANT
SYR 20ML LL LF (SYRINGE) ×2 IMPLANT
TRAY FOLEY MTR SLVR 16FR STAT (SET/KITS/TRAYS/PACK) ×2 IMPLANT
TUBING INJECTOR 48 (MISCELLANEOUS) ×2 IMPLANT
WIRE AMPLATZ SS-J .035X180CM (WIRE) ×2 IMPLANT
WIRE BENTSON .035X145CM (WIRE) ×4 IMPLANT
WIRE TORQFLEX AUST .018X40CM (WIRE) IMPLANT

## 2022-10-09 NOTE — H&P (Signed)
HPI:   Andrea Little is a 72 y.o. female with abdominal aortic aneurysm which was known to have recently grown in size.  She now follows up with CT scan.  Continues to have abdominal pain this is chronic in nature.  No new back pain and no new abdominal pain.  She has been followed by Dr. Virgina Jock with cardiology in the past.       Past Medical History:  Diagnosis Date   AAA (abdominal aortic aneurysm) ([place])     Back pain     Cancer ([place])      skin   Chronic kidney disease     COPD (chronic obstructive pulmonary disease) ([place])     Hyperlipidemia     Hypertension     Major depression     Mood disorder ([place])     Tremor      History reviewed. No pertinent family history. History reviewed. No pertinent surgical history.   Short Social History:  Social History         Tobacco Use   Smoking status: Every Day      Packs/day: 1.00      Types: Cigarettes   Smokeless tobacco: Never   Tobacco comments:      1 ppd   Substance Use Topics   Alcohol use: Not Currently           Allergies  Allergen Reactions   Atorvastatin        Other reaction(s): leg cramps   Hydrochlorothiazide        Other reaction(s): hyponatremia   Penicillin G        Other reaction(s): Other (See Comments) As a child does not recall the reaction             Current Outpatient Medications  Medication Sig Dispense Refill   acetaminophen (TYLENOL) 500 MG tablet 2 tablets as needed       ALPRAZolam (XANAX) 1 MG tablet Take 1 mg by mouth 3 (three) times daily as needed.       amLODipine (NORVASC) 5 MG tablet Take 5 mg by mouth daily.       atenolol (TENORMIN) 100 MG tablet Take 100 mg by mouth daily.       benazepril (LOTENSIN) 40 MG tablet Take 40 mg by mouth daily.       Calcium Carbonate-Vit D-Min (CALCIUM 1200) 1200-1000 MG-UNIT CHEW 1 tablet       fluticasone (FLONASE) 50 MCG/ACT nasal spray         traMADol (ULTRAM) 50 MG tablet Take 50 mg by mouth every 6 (six) hours as needed.        umeclidinium-vilanterol (ANORO ELLIPTA) 62.5-25 MCG/ACT AEPB Inhale 1 puff into the lungs daily. 60 each 11   venlafaxine XR (EFFEXOR-XR) 150 MG 24 hr capsule Take 150 mg by mouth daily. with food       fexofenadine (ALLEGRA) 180 MG tablet Take 180 mg by mouth daily as needed. (Patient not taking: Reported on 08/05/2022)        No current facility-administered medications for this visit.      Review of Systems  Constitutional:  Constitutional negative. HENT: HENT negative.  Eyes: Eyes negative.  Respiratory: Respiratory negative.  Cardiovascular: Cardiovascular negative.  GI: Positive for abdominal pain.  Musculoskeletal: Musculoskeletal negative.  Skin: Skin negative.  Neurological: Neurological negative. Hematologic: Hematologic/lymphatic negative.  Psychiatric: Psychiatric negative.          Objective:    Vitals:   10/09/22  0610  BP: (!) 143/51  Pulse: (!) 59  Resp: 18  Temp: 98.5 F (36.9 C)  SpO2: 96%        Physical Exam HENT:     Head: Normocephalic.     Mouth/Throat:     Mouth: Mucous membranes are moist.  Eyes:     Pupils: Pupils are equal, round, and reactive to light.  Cardiovascular:     Rate and Rhythm: Normal rate.     Palpable PT bilaterally Abdominal:     General: Abdomen is flat.     Palpations: There is mass.  Musculoskeletal:        General: Normal range of motion.     Cervical back: Normal range of motion and neck supple.     Right lower leg: No edema.     Left lower leg: No edema.  Skin:    General: Skin is warm.     Capillary Refill: Capillary refill takes less than 2 seconds.  Neurological:     General: No focal deficit present.     Mental Status: She is alert.  Psychiatric:        Mood and Affect: Mood normal.        Behavior: Behavior normal.        Thought Content: Thought content normal.        Judgment: Judgment normal.        Data: CT IMPRESSION: 1. Since last month's study, no significant enlargement of 5.6  cm infrarenal abdominal aortic aneurysm with medial saccular component. Some decrease in the periaortic inflammatory/edematous changes seen previously. Current guidelines recommend referral to a vascular specialist (the patient is currently under the care of a vascular specialist/surgeon). 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 3. Cholelithiasis. 4.  Aortic Atherosclerosis (ICD10-I70.0).      Assessment/Plan:    72 year old female now 5.6 cm abdominal aortic aneurysm.  Plan is for [facility]AR today in the OR under spinal anesthesia. Discussed risks and benefits and she demonstrates good understanding.        [name] MD [facility]

## 2022-10-09 NOTE — Discharge Instructions (Signed)
  [facility]    Discharge Instructions  Endovascular Aortic Aneurysm Repair  Please refer to the following instructions for your post-procedure care. Your surgeon or Physician Assistant will discuss any changes with you.  Activity  You are encouraged to walk as much as you can. You can slowly return to normal activities but must avoid strenuous activity and heavy lifting until your doctor tells you it's OK. Avoid activities such as vacuuming or swinging a gold club. It is normal to feel tired for several weeks after your surgery. Do not drive until your doctor gives the OK and you are no longer taking prescription pain medications. It is also normal to have difficulty with sleep habits, eating, and bowel movements after surgery. These will go away with time.  Bathing/Showering  Shower daily after you go home.  Do not soak in a bathtub, hot tub, or swim until the incision heals completely.  If you have incisions in your groin, wash the groin wounds with soap and water daily and pat dry. (No tub bath-only shower)  Then put a dry gauze or washcloth there to keep this area dry to help prevent wound infection daily and as needed.  Do not use Vaseline or neosporin on your incisions.  Only use soap and water on your incisions and then protect and keep dry.  Incision Care  Shower every day. Clean your incision with mild soap and water. Pat the area dry with a clean towel. You do not need a bandage unless otherwise instructed. Do not apply any ointments or creams to your incision. If you clothing is irritating, you may cover your incision with a dry gauze pad.  Diet  Resume your normal diet. There are no special food restrictions following this procedure. A low fat/low cholesterol diet is recommended for all patients with vascular disease. In order to heal from your surgery, it is CRITICAL to get adequate nutrition. Your body requires vitamins, minerals, and protein.  Vegetables are the best source of vitamins and minerals. Vegetables also provide the perfect balance of protein. Processed food has little nutritional value, so try to avoid this.  Medications  Resume taking all of your medications unless your doctor or nurse practitioner tells you not to. If your incision is causing pain, you may take over-the-counter pain relievers such as acetaminophen (Tylenol). If you were prescribed a stronger pain medication, please be aware these medications can cause nausea and constipation. Prevent nausea by taking the medication with a snack or meal. Avoid constipation by drinking plenty of fluids and eating foods with a high amount of fiber, such as fruits, vegetables, and grains.  Do not take Tylenol if you are taking prescription pain medications.   Follow up  Our office will schedule a follow-up appointment with a CT scan 3-4 weeks after your surgery.  Please call us immediately for any of the following conditions  Severe or worsening pain in your legs or feet or in your abdomen back or chest. Increased pain, redness, drainage (pus) from your incision site. Increased abdominal pain, bloating, nausea, vomiting or persistent diarrhea. Fever of 101 degrees or higher. Swelling in your leg (s),  Reduce your risk of vascular disease  Stop smoking. If you would like help call [facility] at 1-800-QUIT-NOW (1-800-784-8669) or [facility] at [phone]. Manage your cholesterol Maintain a desired weight Control your diabetes Keep your blood pressure down  If you have questions, please call the office at [phone].  

## 2022-10-09 NOTE — Anesthesia Procedure Notes (Signed)
Spinal  Patient location during procedure: holding area Start time: 10/09/2022 7:30 AM End time: 10/09/2022 7:34 AM Reason for block: surgical anesthesia Staffing Anesthesiologist: Oleta Mouse, MD Performed by: Oleta Mouse, MD Authorized by: Oleta Mouse, MD   Preanesthetic Checklist Completed: patient identified, IV checked, risks and benefits discussed, surgical consent, monitors and equipment checked, pre-op evaluation and timeout performed Spinal Block Patient position: sitting Prep: DuraPrep Patient monitoring: heart rate, cardiac monitor, continuous pulse ox and blood pressure Approach: midline Location: L4-5 Injection technique: single-shot Needle Needle type: Sprotte and Pencan  Needle gauge: 24 G Needle length: 9 cm Assessment Sensory level: T8 Events: CSF return

## 2022-10-09 NOTE — Anesthesia Procedure Notes (Signed)
Procedure Name: MAC Date/Time: 10/09/2022 7:30 AM  Performed by: Griffin Dakin, CRNAPre-anesthesia Checklist: Patient identified, Emergency Drugs available, Suction available, Patient being monitored and Timeout performed Patient Re-evaluated:Patient Re-evaluated prior to induction Oxygen Delivery Method: Simple face mask Induction Type: IV induction Placement Confirmation: positive ETCO2 and breath sounds checked- equal and bilateral Dental Injury: Teeth and Oropharynx as per pre-operative assessment

## 2022-10-09 NOTE — Op Note (Signed)
Patient name: Andrea Little MRN: [id number] DOB: Apr 13, 1951 Sex: female  10/09/2022 Pre-operative Diagnosis: 5.5 cm abdominal aortic aneurysm Post-operative diagnosis:  Same Surgeon:  Erlene Quan C. Donzetta Matters, MD Assistant: Leontine Locket, PA Procedure Performed: 1.  Percutaneous access and closure bilateral common femoral arteries using ultrasound guidance and Pro-glide devices x 2 in both groins 2.  Endovascular aortic aneurysm repair with main body right 20 x 14 x 12 extended by 12 x 12 cm device and contralateral limb 12 x 14 cm device 3.  Stent of left common iliac artery with 10 x 37 mm LD express within the limb and extended limb with 9 x 29 mm VBX  Indications: 72 year old female with abdominal aortic aneurysm that now meets criteria for repair.  Patient is high risk with multiple medical comorbidities and is indicated for spinal block for procedure.  Findings: The common femoral arteries bilaterally were patent with minimal disease for percutaneous access.  The common iliac arteries bilaterally were heavily diseased.  There is only one renal artery which is the right and the device was landed just below this.  After initial placement of our endograft the left limb appeared to be pinched at the aortic bifurcation and this was expanded with a 10 x 37 mm LD express balloon expandable stent and distally graft.  And I extended distally with a 9 x 29 mm VBX on the left as well.  At completion there were no endoleak's noted and there were palpable pedal pulses after sheath were removed.   Procedure:  The patient was identified in the holding area and taken to the operating room where she was placed supine operative table.  A preoperative spinal block been placed and MAC anesthesia was induced and antibiotics were administered.  She was gently prepped and draped in the abdomen bilateral legs in the usual fashion and a timeout was called.  We began using ultrasound guidance to cannulate first the left common  femoral artery which was done with micropuncture needle followed by a Bentson wire and dilated with 8 French sheath and 2 Pro-glide devices were placed followed by a long 8 French sheath under fluoroscopic guidance.  On the right side similarly we used a micropuncture needle followed by wire and sheath and a Bentson wire followed by 8 French dilation and 2 Pro-glide devices.  We then placed up wires into the aorta and placed a 12 French sheath on the left and 16 French sheath on the right patient was fully heparinized.  The main body was placed from the right to the level of L1 with the gate directed anteriorly and on the left side 12 French sheath was extended up into the aorta.  We then performed aortogram via Omni catheter from the left.  This identified the right renal artery which was a single renal artery.  The device was then opened.  Angiography after that demonstrated impingement on the right renal artery it was read constrained moved down and angiography now demonstrated good patency.  Satisfied with this we finished deploying the top of the device.  We then cannulated the gate from the left side using Berenstein catheter and Glidewire advantage and then placed an Omni catheter and confirmed intraluminal access by spinning this and we performed angiography which demonstrated a renal artery as well as patency.  We then placed the limb on the left which was 12 x 14 cm.  On the right side we then completed the device perform retrograde angiography and then extended down  with a 12 x 12 cm device.  The entirety of the graft was ironed out.  After this AP views demonstrated some impingement on the right renal artery so we went back to our cranial and LAO views and again angiography demonstrated patency of the right renal artery without impingement.  There did appear to be some narrowing of the graft and the left common iliac artery at the aortic bifurcatio and for this a balloon expandable LD express stent was  placed to nominal pressure 10 x 37 was the size.  We then extended distally with 9 x 29 mm VBX given that there was some impingement on the distal graft.  Completion retrograde angiography demonstrated patency of the limb without further impingement.  Satisfied with this we exchanged for Bentson wires and both sheaths and then deployed the Pro-glide devices.  There was good hemostasis in both groins.  There were strong signals of the PTs in both feet and 50 mg of protamine was administered.  We then closed the groins with 4-0 Monocryl and Dermabond was placed at the skin level.  There were palpable pedal pulses after this both feet and both were well-perfused.  The patient was then awakened from anesthesia having tolerated the procedure without any complication.  All counts were correct at completion and she was transferred to the recovery area in stable condition.  Contrast: 120cc  EBL: 100 cc   Renny Gunnarson C. Donzetta Matters, MD [facility] Office: [phone] Pager: [phone]

## 2022-10-09 NOTE — Transfer of Care (Signed)
Immediate Anesthesia Transfer of Care Note  Patient: Andrea Little  Procedure(s) Performed: ABDOMINAL AORTIC ENDOVASCULAR STENT GRAFT (Abdomen) ULTRASOUND GUIDANCE FOR VASCULAR ACCESS, BILATERAL FEMORAL ARTERIES (Bilateral: Groin)  Patient Location: PACU  Anesthesia Type:MAC and Spinal  Level of Consciousness: awake, alert , and oriented  Airway & Oxygen Therapy: Patient Spontanous Breathing and Patient connected to nasal cannula oxygen  Post-op Assessment: Report given to RN and Post -op Vital signs reviewed and stable  Post vital signs: Reviewed and stable  Last Vitals:  Vitals Value Taken Time  BP 130/61 10/09/22 1000  Temp    Pulse 56 10/09/22 1001  Resp 14 10/09/22 1001  SpO2 96 % 10/09/22 1001  Vitals shown include unvalidated device data.  Last Pain:  Vitals:   10/09/22 0610  TempSrc: Oral  PainSc:          Complications: No notable events documented.

## 2022-10-09 NOTE — Anesthesia Procedure Notes (Signed)
Arterial Line Insertion Start/End3/03/2023 7:10 AM, 10/09/2022 7:15 AM Performed by: Griffin Dakin, CRNA  Patient location: Pre-op. Preanesthetic checklist: patient identified, IV checked, site marked, risks and benefits discussed, surgical consent, monitors and equipment checked, pre-op evaluation, timeout performed and anesthesia consent Lidocaine 1% used for infiltration Left, radial was placed Catheter size: 20 G Hand hygiene performed  and maximum sterile barriers used   Attempts: 1 Procedure performed without using ultrasound guided technique. Following insertion, dressing applied and Biopatch. Post procedure assessment: normal and unchanged  Patient tolerated the procedure well with no immediate complications.

## 2022-10-10 LAB — BASIC METABOLIC PANEL
Anion gap: 10 (ref 5–15)
BUN: 19 mg/dL (ref 8–23)
CO2: 19 mmol/L — ABNORMAL LOW (ref 22–32)
Calcium: 9.3 mg/dL (ref 8.9–10.3)
Chloride: 102 mmol/L (ref 98–111)
Creatinine, Ser: 1.1 mg/dL — ABNORMAL HIGH (ref 0.44–1.00)
GFR, Estimated: 53 mL/min — ABNORMAL LOW (ref >60)
Glucose, Bld: 133 mg/dL — ABNORMAL HIGH (ref 70–99)
Potassium: 4 mmol/L (ref 3.5–5.1)
Sodium: 131 mmol/L — ABNORMAL LOW (ref 135–145)

## 2022-10-10 LAB — CBC
HCT: 40.7 % (ref 36.0–46.0)
Hemoglobin: 13.7 g/dL (ref 12.0–15.0)
MCH: 29.1 pg (ref 26.0–34.0)
MCHC: 33.7 g/dL (ref 30.0–36.0)
MCV: 86.6 fL (ref 80.0–100.0)
Platelets: 197 10*3/uL (ref 150–400)
RBC: 4.7 MIL/uL (ref 3.87–5.11)
RDW: 12 % (ref 11.5–15.5)
WBC: 16.2 10*3/uL — ABNORMAL HIGH (ref 4.0–10.5)
nRBC: 0 % (ref 0.0–0.2)

## 2022-10-10 MED ORDER — CLOPIDOGREL BISULFATE 75 MG PO TABS: 75.0000 mg | ORAL_TABLET | Freq: Every day | ORAL | 11 refills | Status: DC

## 2022-10-10 MED ORDER — ASPIRIN 81 MG PO TBEC: 81.0000 mg | DELAYED_RELEASE_TABLET | Freq: Every day | ORAL | 12 refills | Status: AC

## 2022-10-10 NOTE — Progress Notes (Signed)
  Progress Note    10/10/2022 7:27 AM 1 Day Post-Op  Subjective:  wants to go home.  No complaints  Afebrile HR 60's-70's  947'S-962'E systolic 36% 6QH4TM  Vitals:   10/10/22 0300 10/10/22 0400  BP: (!) 154/73 (!) 157/76  Pulse:  76  Resp: 20 20  Temp:  97.6 F (36.4 C)  SpO2: 94% 96%    Physical Exam: General:  no distress sitting in chair Cardiac:  regular Lungs:  non labored Incisions:  bilateral groins soft without hematoma Extremities:  palpable DP pulses bilaterally Abdomen:  soft  CBC    Component Value Date/Time   WBC 16.2 (H) 10/10/2022 0350   RBC 4.70 10/10/2022 0350   HGB 13.7 10/10/2022 0350   HCT 40.7 10/10/2022 0350   PLT 197 10/10/2022 0350   MCV 86.6 10/10/2022 0350   MCH 29.1 10/10/2022 0350   MCHC 33.7 10/10/2022 0350   RDW 12.0 10/10/2022 0350   LYMPHSABS 1.9 06/12/2022 1627   MONOABS 0.8 06/12/2022 1627   EOSABS 0.1 06/12/2022 1627   BASOSABS 0.1 06/12/2022 1627    BMET    Component Value Date/Time   NA 131 (L) 10/10/2022 0350   K 4.0 10/10/2022 0350   CL 102 10/10/2022 0350   CO2 19 (L) 10/10/2022 0350   GLUCOSE 133 (H) 10/10/2022 0350   BUN 19 10/10/2022 0350   CREATININE 1.10 (H) 10/10/2022 0350   CALCIUM 9.3 10/10/2022 0350   GFRNONAA 53 (L) 10/10/2022 0350    INR    Component Value Date/Time   INR 1.1 10/09/2022 1000     Intake/Output Summary (Last 24 hours) at 10/10/2022 0727 Last data filed at 10/10/2022 5465 Gross per 24 hour  Intake 640 ml  Output 1300 ml  Net -660 ml      Assessment/Plan:  72 y.o. female is s/p:  EVAR  1 Day Post-Op   -pt doing well this morning. Discharge home today -home on asa/plavix -f/u with Dr. Donzetta Matters in 4 weeks with CTA a/p.   -DVT prophylaxis:  sq heparin   Leontine Locket, PA-C [facility] [phone] 10/10/2022 7:27 AM

## 2022-10-10 NOTE — Discharge Summary (Signed)
EVAR Discharge Summary   Andrea Little 02-02-1951 72 y.o. female  MRN: [id number]  Admission Date: 10/09/2022  Discharge Date: 10/10/2022   Physician: Thomes Lolling*  Admission Diagnosis: AAA (abdominal aortic aneurysm) ([place]) [I71.40]   HPI:   This is a 72 y.o. female  with abdominal aortic aneurysm which was known to have recently grown in size.  She now follows up with CT scan.  Continues to have abdominal pain this is chronic in nature.  No new back pain and no new abdominal pain.  She has been followed by Dr. Virgina Jock with cardiology in the past.   Hospital Course:  The patient was admitted to the hospital and taken to the operating room on 10/09/2022 and underwent: 1.  Percutaneous access and closure bilateral common femoral arteries using ultrasound guidance and Pro-glide devices x 2 in both groins 2.  Endovascular aortic aneurysm repair with main body right 20 x 14 x 12 extended by 12 x 12 cm device and contralateral limb 12 x 14 cm device 3.  Stent of left common iliac artery with 10 x 37 mm LD express within the limb and extended limb with 9 x 29 mm VBX    Findings: The common femoral arteries bilaterally were patent with minimal disease for percutaneous access.  The common iliac arteries bilaterally were heavily diseased.  There is only one renal artery which is the right and the device was landed just below this.  After initial placement of our endograft the left limb appeared to be pinched at the aortic bifurcation and this was expanded with a 10 x 37 mm LD express balloon expandable stent and distally graft.  And I extended distally with a 9 x 29 mm VBX on the left as well.  At completion there were no endoleak's noted and there were palpable pedal pulses after sheath were removed.               The pt tolerated the procedure well and was transported to the PACU in good condition.   By POD 1, pt doing well with palpable pedal pulses and bilateral groins soft  without hematoma.  Abdomen soft and non tender.  Pt able to void and ambulate.    CBC    Component Value Date/Time   WBC 16.2 (H) 10/10/2022 0350   RBC 4.70 10/10/2022 0350   HGB 13.7 10/10/2022 0350   HCT 40.7 10/10/2022 0350   PLT 197 10/10/2022 0350   MCV 86.6 10/10/2022 0350   MCH 29.1 10/10/2022 0350   MCHC 33.7 10/10/2022 0350   RDW 12.0 10/10/2022 0350   LYMPHSABS 1.9 06/12/2022 1627   MONOABS 0.8 06/12/2022 1627   EOSABS 0.1 06/12/2022 1627   BASOSABS 0.1 06/12/2022 1627    BMET    Component Value Date/Time   NA 131 (L) 10/10/2022 0350   K 4.0 10/10/2022 0350   CL 102 10/10/2022 0350   CO2 19 (L) 10/10/2022 0350   GLUCOSE 133 (H) 10/10/2022 0350   BUN 19 10/10/2022 0350   CREATININE 1.10 (H) 10/10/2022 0350   CALCIUM 9.3 10/10/2022 0350   GFRNONAA 53 (L) 10/10/2022 0350       Discharge Instructions     Discharge patient   Complete by: As directed    Discharge home once she has walked in hallways and voided.   Discharge disposition: 01-Home or Self Care   Discharge patient date: 10/10/2022       Discharge Diagnosis:  AAA (abdominal aortic aneurysm) ([place]) [  I71.40]  Secondary Diagnosis: Patient Active Problem List   Diagnosis Date Noted   AAA (abdominal aortic aneurysm) ([place]) 10/09/2022   COPD (chronic obstructive pulmonary disease) ([place]) 09/10/2021   Low back pain radiating to right leg 06/04/2020   DDD (degenerative disc disease), lumbar 05/17/2020   Facet arthritis of lumbar region 05/17/2020   Spinal stenosis of lumbar region with neurogenic claudication 05/17/2020   Past Medical History:  Diagnosis Date   AAA (abdominal aortic aneurysm) (H[facility])    Anxiety    Arthritis    Back pain    Cancer (HCC)    skin   Chronic kidney disease    COPD (chronic obstructive pulmonary disease) ([place])    Dyspnea    Hyperlipidemia    Hypertension    Major depression    Mood disorder ([place])    Pneumonia 1984   Tremor      Allergies as of 10/10/2022        Reactions   Atorvastatin    Other reaction(s): leg cramps   Hydrochlorothiazide    Other reaction(s): hyponatremia   Penicillin G    Other reaction(s): Other (See Comments) As a child does not recall the reaction         Medication List     TAKE these medications    ALPRAZolam 1 MG tablet Commonly known as: XANAX Take 1 mg by mouth 3 (three) times daily.   amLODipine 5 MG tablet Commonly known as: NORVASC Take 1 tablet (5 mg total) by mouth 2 (two) times daily.   Anoro Ellipta 62.5-25 MCG/ACT Aepb Generic drug: umeclidinium-vilanterol Inhale 1 puff into the lungs daily.   aspirin EC 81 MG tablet Take 1 tablet (81 mg total) by mouth daily at 6 (six) AM. Swallow whole. Start taking on: October 11, 2022   atenolol 100 MG tablet Commonly known as: TENORMIN Take 100 mg by mouth daily.   benazepril 40 MG tablet Commonly known as: LOTENSIN Take 40 mg by mouth daily.   Calcium 600/Vitamin D 600-10 MG-MCG Tabs Generic drug: Calcium Carb-Cholecalciferol Take 1 tablet by mouth daily.   clopidogrel 75 MG tablet Commonly known as: PLAVIX Take 1 tablet (75 mg total) by mouth daily at 6 (six) AM. Start taking on: October 11, 2022   rosuvastatin 5 MG tablet Commonly known as: CRESTOR Take 1 tablet (5 mg total) by mouth daily.   traMADol 50 MG tablet Commonly known as: ULTRAM Take 50 mg by mouth every 6 (six) hours as needed for moderate pain.   venlafaxine XR 150 MG 24 hr capsule Commonly known as: EFFEXOR-XR Take 150 mg by mouth daily. with food        Discharge Instructions:  [facility]  Discharge Instructions Endovascular Aortic Aneurysm Repair  Please refer to the following instructions for your post-procedure care. Your surgeon or Physician Assistant will discuss any changes with you.  Activity  You are encouraged to walk as much as you can. You can slowly return to normal activities but must avoid strenuous activity and  heavy lifting until your doctor tells you it's OK. Avoid activities such as vacuuming or swinging a gold club. It is normal to feel tired for several weeks after your surgery. Do not drive until your doctor gives the OK and you are no longer taking prescription pain medications. It is also normal to have difficulty with sleep habits, eating, and bowel movements after surgery. These will go away with time.  Bathing/Showering  You may  shower after you go home. If you have an incision, do not soak in a bathtub, hot tub, or swim until the incision heals completely.  Incision Care  Shower every day. Clean your incision with mild soap and water. Pat the area dry with a clean towel. You do not need a bandage unless otherwise instructed. Do not apply any ointments or creams to your incision. If you clothing is irritating, you may cover your incision with a dry gauze pad.  Diet  Resume your normal diet. There are no special food restrictions following this procedure. A low fat/low cholesterol diet is recommended for all patients with vascular disease. In order to heal from your surgery, it is CRITICAL to get adequate nutrition. Your body requires vitamins, minerals, and protein. Vegetables are the best source of vitamins and minerals. Vegetables also provide the perfect balance of protein. Processed food has little nutritional value, so try to avoid this.  Medications  Resume taking all of your medications unless your doctor or Physician Assistnat tells you not to. If your incision is causing pain, you may take over-the-counter pain relievers such as acetaminophen (Tylenol). If you were prescribed a stronger pain medication, please be aware these medications can cause nausea and constipation. Prevent nausea by taking the medication with a snack or meal. Avoid constipation by drinking plenty of fluids and eating foods with a high amount of fiber, such as fruits, vegetables, and grains.  Do not take Tylenol if  you are taking prescription pain medications.   Follow up  [place] office will schedule a follow-up appointment with a C.T. scan 3-4 weeks after your surgery.  Please call us immediately for any of the following conditions  Severe or worsening pain in your legs or feet or in your abdomen back or chest. Increased pain, redness, drainage (pus) from your incision sit. Increased abdominal pain, bloating, nausea, vomiting or persistent diarrhea. Fever of 101 degrees or higher. Swelling in your leg (s),  Reduce your risk of vascular disease  Stop smoking. If you would like help call [facility] at 1-800-QUIT-NOW 318-029-6230) or [place] at [phone]. Manage your cholesterol Maintain a desired weight Control your diabetes Keep your blood pressure down  If you have questions, please call the office at [phone].   Prescriptions given: 1.  Plavix '75mg'$  daily #30 11 RF 2.   Asa '81mg'$  daily (PDMP reviewed and Pain medication not prescribed due to receiving monthly)  Disposition: home  Patient's condition: is Good  Follow up: 1. Dr. Donzetta Matters in 4 weeks with CTA protocol   Leontine Locket, PA-C [facility] [phone] 10/10/2022  8:52 AM   - For VQI Registry use - Post-op:  Time to Extubation: '[x]'$  In OR, '[ ]'$  < 12 hrs, '[ ]'$  12-24 hrs, '[ ]'$  >=24 hrs Vasopressors Req. Post-op: No MI: No., '[ ]'$  Troponin only, '[ ]'$  EKG or Clinical New Arrhythmia: No CHF: No ICU Stay: 1 day in progressive Transfusion: No     If yes, n/a units given  Complications: Resp failure: No., '[ ]'$  Pneumonia, '[ ]'$  Ventilator Chg in renal function: No., '[ ]'$  Inc. Cr > 0.5, '[ ]'$  Temp. Dialysis,  '[ ]'$  Permanent dialysis Leg ischemia: No., no Surgery needed, '[ ]'$  Yes, Surgery needed,  '[ ]'$  Amputation Bowel ischemia: No., '[ ]'$  Medical Rx, '[ ]'$  Surgical Rx Wound complication: No., '[ ]'$  Superficial separation/infection, '[ ]'$  Return to OR Return to OR: No  Return to OR for bleeding: No Stroke: No., '[ ]'$   Minor, '[ ]'$  Major  Discharge medications: Statin use:  Yes  ASA use:  Yes  Plavix use:  Yes  Beta blocker use:  Yes  ARB use:  No ACEI use:  Yes CCB use:  Yes

## 2022-10-10 NOTE — Anesthesia Postprocedure Evaluation (Signed)
Anesthesia Post Note  Patient: Andrea Little  Procedure(s) Performed: ABDOMINAL AORTIC ENDOVASCULAR STENT GRAFT (Abdomen) ULTRASOUND GUIDANCE FOR VASCULAR ACCESS, BILATERAL FEMORAL ARTERIES (Bilateral: Groin)     Patient location during evaluation: PACU Anesthesia Type: MAC and Spinal Level of consciousness: awake and patient cooperative Pain management: pain level controlled Vital Signs Assessment: post-procedure vital signs reviewed and stable Respiratory status: spontaneous breathing, nonlabored ventilation and respiratory function stable Cardiovascular status: stable and blood pressure returned to baseline Postop Assessment: no apparent nausea or vomiting Anesthetic complications: no   No notable events documented.  Last Vitals:  Vitals:   10/10/22 0400 10/10/22 0738  BP: (!) 157/76 (!) 154/73  Pulse: 76 69  Resp: 20 15  Temp: 36.4 C (!) 36.4 C  SpO2: 96% 94%    Last Pain:  Vitals:   10/10/22 0738  TempSrc: Oral  PainSc:                  [name]

## 2022-10-10 NOTE — Progress Notes (Signed)
Patient and husband verbalized understanding of dc instructions. All belongings given to patient. Ccmd notified of dc order.

## 2022-10-12 ENCOUNTER — Encounter (HOSPITAL_COMMUNITY): Payer: Self-pay | Admitting: Vascular Surgery

## 2022-10-13 ENCOUNTER — Telehealth: Payer: Self-pay

## 2022-10-13 ENCOUNTER — Telehealth: Payer: Self-pay | Admitting: Physician Assistant

## 2022-10-13 NOTE — Telephone Encounter (Signed)
Andrea Little with [facility] called stating that the pt's PCP initiated [facility] for PT prior to the pt's EVAR on 3/8. The pt wanted to restart PT, so she was calling to review any restrictions.  Reviewed pt's chart, returned call for clarification, no answer, lf vm as directed giving the activity restrictions written in the pt's d/c paperwork. Confirmed with Sam, PA that there was nothing additional.

## 2022-10-13 NOTE — Telephone Encounter (Signed)
-----   Message from Gabriel Earing, [place] sent at 10/09/2022  9:51 AM EST ----- S/p EVAR 3/8.  F/u in 4 weeks with CTA a/p with Dr. Donzetta Matters.  Thanks

## 2022-10-16 ENCOUNTER — Telehealth: Payer: Self-pay

## 2022-10-16 NOTE — Telephone Encounter (Signed)
Pt called stating that she was having issues and she wasn't sure if they were related to her recent surgery or her back.  Reviewed pt's chart, returned call for clarification, two identifiers used. Pt stated that she fell in the house on Monday, the 11th and she is bruised and skinned up on her face, L side, and knee. She has significant history of back problems. She denies any LE swelling or pain. She c/o abdominal pain, but it is comparable to what she's been experiencing. Instructed her to continue monitoring and that the lack of concerning symptoms was a good sign. Instructed her to try to be careful and not have any more falls. She is to go to ED if she has severe symptoms and call this office if any symptoms worsen next week. Confirmed understanding.

## 2022-10-17 DIAGNOSIS — Z48812 Encounter for surgical aftercare following surgery on the circulatory system: Secondary | ICD-10-CM | POA: Diagnosis not present

## 2022-10-17 DIAGNOSIS — F39 Unspecified mood [affective] disorder: Secondary | ICD-10-CM | POA: Diagnosis not present

## 2022-10-17 DIAGNOSIS — M5136 Other intervertebral disc degeneration, lumbar region: Secondary | ICD-10-CM | POA: Diagnosis not present

## 2022-10-17 DIAGNOSIS — R69 Illness, unspecified: Secondary | ICD-10-CM | POA: Diagnosis not present

## 2022-10-17 DIAGNOSIS — M48062 Spinal stenosis, lumbar region with neurogenic claudication: Secondary | ICD-10-CM | POA: Diagnosis not present

## 2022-10-17 DIAGNOSIS — I129 Hypertensive chronic kidney disease with stage 1 through stage 4 chronic kidney disease, or unspecified chronic kidney disease: Secondary | ICD-10-CM | POA: Diagnosis not present

## 2022-10-17 DIAGNOSIS — M47816 Spondylosis without myelopathy or radiculopathy, lumbar region: Secondary | ICD-10-CM | POA: Diagnosis not present

## 2022-10-17 DIAGNOSIS — F329 Major depressive disorder, single episode, unspecified: Secondary | ICD-10-CM | POA: Diagnosis not present

## 2022-10-17 DIAGNOSIS — N189 Chronic kidney disease, unspecified: Secondary | ICD-10-CM | POA: Diagnosis not present

## 2022-10-17 DIAGNOSIS — M545 Low back pain, unspecified: Secondary | ICD-10-CM | POA: Diagnosis not present

## 2022-10-17 DIAGNOSIS — J449 Chronic obstructive pulmonary disease, unspecified: Secondary | ICD-10-CM | POA: Diagnosis not present

## 2022-10-20 ENCOUNTER — Telehealth: Payer: Self-pay

## 2022-10-20 NOTE — Telephone Encounter (Signed)
Pt called asking about stomach pain.  Reviewed pt's chart, returned call for clarification, two identifiers used. Discussed with pt on 3/15, but pt didn't remember. She stated that she has memory issues. Asked her if the pain had gotten worse and she denied any worsening. The pain she experienced prior to surgery has not improved. She has a post-op appt on 4/17 after a CTA. She has not been scheduled for that. Informed her that an inquiry would be made for the CTA and she would receive a call to schedule. Instructed her to go to the ED if she has severe pain. Confirmed understanding.

## 2022-10-21 DIAGNOSIS — Z79899 Other long term (current) drug therapy: Secondary | ICD-10-CM | POA: Diagnosis not present

## 2022-10-21 DIAGNOSIS — M5451 Vertebrogenic low back pain: Secondary | ICD-10-CM | POA: Diagnosis not present

## 2022-10-21 DIAGNOSIS — G8929 Other chronic pain: Secondary | ICD-10-CM | POA: Diagnosis not present

## 2022-10-21 DIAGNOSIS — M545 Low back pain, unspecified: Secondary | ICD-10-CM | POA: Diagnosis not present

## 2022-10-22 ENCOUNTER — Telehealth: Payer: Self-pay

## 2022-10-22 NOTE — Telephone Encounter (Signed)
Betsy PT with [facility] called to give an update on this pt. Pt fell on this past Monday. Pt has facial bruising and edema. She is also c/o abdominal pain, but this is ongoing, not new related to the fall. She was seen at the pain clinic yesterday and her PCP has been made aware.

## 2022-10-27 ENCOUNTER — Telehealth: Payer: Self-pay | Admitting: Vascular Surgery

## 2022-10-27 NOTE — Telephone Encounter (Signed)
Pt was calling from [place] in [facility], provider wanted to inform us that the pt has fallen a couple times since last being seen here. Pt has appt scheduled for 4/17

## 2022-11-03 DIAGNOSIS — G8929 Other chronic pain: Secondary | ICD-10-CM | POA: Diagnosis not present

## 2022-11-03 DIAGNOSIS — R531 Weakness: Secondary | ICD-10-CM | POA: Diagnosis not present

## 2022-11-03 DIAGNOSIS — R58 Hemorrhage, not elsewhere classified: Secondary | ICD-10-CM | POA: Diagnosis not present

## 2022-11-03 DIAGNOSIS — N183 Chronic kidney disease, stage 3 unspecified: Secondary | ICD-10-CM | POA: Diagnosis not present

## 2022-11-03 DIAGNOSIS — Z9889 Other specified postprocedural states: Secondary | ICD-10-CM | POA: Diagnosis not present

## 2022-11-03 DIAGNOSIS — R109 Unspecified abdominal pain: Secondary | ICD-10-CM | POA: Diagnosis not present

## 2022-11-03 DIAGNOSIS — Z8679 Personal history of other diseases of the circulatory system: Secondary | ICD-10-CM | POA: Diagnosis not present

## 2022-11-03 DIAGNOSIS — R69 Illness, unspecified: Secondary | ICD-10-CM | POA: Diagnosis not present

## 2022-11-03 DIAGNOSIS — R296 Repeated falls: Secondary | ICD-10-CM | POA: Diagnosis not present

## 2022-11-03 DIAGNOSIS — E7800 Pure hypercholesterolemia, unspecified: Secondary | ICD-10-CM | POA: Diagnosis not present

## 2022-11-03 DIAGNOSIS — Z9989 Dependence on other enabling machines and devices: Secondary | ICD-10-CM | POA: Diagnosis not present

## 2022-11-03 DIAGNOSIS — M5416 Radiculopathy, lumbar region: Secondary | ICD-10-CM | POA: Diagnosis not present

## 2022-11-10 ENCOUNTER — Ambulatory Visit: Payer: PRIVATE HEALTH INSURANCE | Admitting: Internal Medicine

## 2022-11-12 ENCOUNTER — Other Ambulatory Visit: Payer: Self-pay

## 2022-11-12 DIAGNOSIS — I7142 Juxtarenal abdominal aortic aneurysm, without rupture: Secondary | ICD-10-CM

## 2022-11-14 ENCOUNTER — Ambulatory Visit (HOSPITAL_BASED_OUTPATIENT_CLINIC_OR_DEPARTMENT_OTHER)
Admission: RE | Admit: 2022-11-14 | Discharge: 2022-11-14 | Disposition: A | Discharge status: Home / Self Care | Payer: Medicare HMO | Source: Ambulatory Visit | Attending: Vascular Surgery | Admitting: Vascular Surgery

## 2022-11-14 DIAGNOSIS — I7142 Juxtarenal abdominal aortic aneurysm, without rupture: Secondary | ICD-10-CM | POA: Diagnosis not present

## 2022-11-14 DIAGNOSIS — I714 Abdominal aortic aneurysm, without rupture, unspecified: Secondary | ICD-10-CM | POA: Diagnosis not present

## 2022-11-14 MED ORDER — IOHEXOL 350 MG/ML SOLN
100.0000 mL | Freq: Once | INTRAVENOUS | Status: AC | PRN
  Administered 2022-11-14: 100 mL via INTRAVENOUS

## 2022-11-18 ENCOUNTER — Ambulatory Visit (INDEPENDENT_AMBULATORY_CARE_PROVIDER_SITE_OTHER): Payer: Medicare HMO | Admitting: Vascular Surgery

## 2022-11-18 ENCOUNTER — Encounter: Payer: Self-pay | Admitting: Vascular Surgery

## 2022-11-18 VITALS — BP 130/77 | HR 60 | Temp 97.8°F | Resp 20 | Ht 70.0 in | Wt 161.0 lb

## 2022-11-18 DIAGNOSIS — Z9889 Other specified postprocedural states: Secondary | ICD-10-CM

## 2022-11-18 DIAGNOSIS — Z8679 Personal history of other diseases of the circulatory system: Secondary | ICD-10-CM

## 2022-11-18 MED ORDER — PREDNISONE 10 MG (21) PO TBPK: ORAL_TABLET | ORAL | 0 refills | Status: AC

## 2022-11-18 NOTE — Progress Notes (Signed)
     Subjective:     Patient ID: Andrea Little, female   DOB: 04-24-1951, 72 y.o.   MRN: [id number]  HPI 72year-old female follows up from recent endovascular aneurysm repair of infrarenal abdominal aortic aneurysm.  She does have a rash and has not been feeling well because of thisbut otherwise has recovered well.   Review of Systems rash    Objective:   Physical Exam Vitals:   11/18/22 1339  BP: 130/77  Pulse: 60  Resp: 20  Temp: 97.8 F (36.6 C)  SpO2: 93%       Awake alert oriented Nonlabored respirations Abdomen is soft and nontender Bilateral groins are soft without hematoma and palpable femoral pulses  CTA IMPRESSION: VASCULAR   1. Successful interval endovascular aortic repair of abdominal aortic aneurysm. The excluded aneurysm sac has decreased in size to 5.0 cm and there is no evidence of endoleak. 2. 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 No acute dissection or other significant stenosis.     Assessment:     72 year old female status post EVAR    Plan:     Steroid short course sent to pharmacy for presumed contrast induced rash  F/u in 1 year with EVAR duplex    Andrea Montella C. Randie Heinz, MD [facility] Office: [phone] Pager: [phone]

## 2022-11-20 ENCOUNTER — Telehealth: Payer: Self-pay

## 2022-11-20 NOTE — Telephone Encounter (Signed)
Caller: Patient  Concern: rash, no improvement on Prednisone prescribed by Dr. Randie Heinz  Location: entire body  Consulted: B. Randie Heinz, MD  Resolution: Instructed patient to contact primary care MD if no improvement after finishing Prednisone pack

## 2022-11-24 DIAGNOSIS — M533 Sacrococcygeal disorders, not elsewhere classified: Secondary | ICD-10-CM | POA: Diagnosis not present

## 2022-12-11 DIAGNOSIS — M533 Sacrococcygeal disorders, not elsewhere classified: Secondary | ICD-10-CM | POA: Diagnosis not present

## 2022-12-16 DIAGNOSIS — H52201 Unspecified astigmatism, right eye: Secondary | ICD-10-CM | POA: Diagnosis not present

## 2022-12-16 DIAGNOSIS — H25013 Cortical age-related cataract, bilateral: Secondary | ICD-10-CM | POA: Diagnosis not present

## 2022-12-16 DIAGNOSIS — H5213 Myopia, bilateral: Secondary | ICD-10-CM | POA: Diagnosis not present

## 2022-12-16 DIAGNOSIS — H04123 Dry eye syndrome of bilateral lacrimal glands: Secondary | ICD-10-CM | POA: Diagnosis not present

## 2022-12-16 DIAGNOSIS — H524 Presbyopia: Secondary | ICD-10-CM | POA: Diagnosis not present

## 2022-12-16 DIAGNOSIS — H2513 Age-related nuclear cataract, bilateral: Secondary | ICD-10-CM | POA: Diagnosis not present

## 2022-12-16 DIAGNOSIS — H5500 Unspecified nystagmus: Secondary | ICD-10-CM | POA: Diagnosis not present

## 2022-12-29 DIAGNOSIS — M48062 Spinal stenosis, lumbar region with neurogenic claudication: Secondary | ICD-10-CM | POA: Diagnosis not present

## 2022-12-29 DIAGNOSIS — M5451 Vertebrogenic low back pain: Secondary | ICD-10-CM | POA: Diagnosis not present

## 2023-01-12 DIAGNOSIS — M48062 Spinal stenosis, lumbar region with neurogenic claudication: Secondary | ICD-10-CM | POA: Diagnosis not present

## 2023-01-12 DIAGNOSIS — M5431 Sciatica, right side: Secondary | ICD-10-CM | POA: Diagnosis not present

## 2023-01-12 DIAGNOSIS — M5432 Sciatica, left side: Secondary | ICD-10-CM | POA: Diagnosis not present

## 2023-02-08 DIAGNOSIS — H04123 Dry eye syndrome of bilateral lacrimal glands: Secondary | ICD-10-CM | POA: Diagnosis not present

## 2023-02-08 DIAGNOSIS — H21562 Pupillary abnormality, left eye: Secondary | ICD-10-CM | POA: Diagnosis not present

## 2023-02-08 DIAGNOSIS — H25813 Combined forms of age-related cataract, bilateral: Secondary | ICD-10-CM | POA: Diagnosis not present

## 2023-02-08 DIAGNOSIS — H5500 Unspecified nystagmus: Secondary | ICD-10-CM | POA: Diagnosis not present

## 2023-02-08 DIAGNOSIS — H15833 Staphyloma posticum, bilateral: Secondary | ICD-10-CM | POA: Diagnosis not present

## 2023-02-22 DIAGNOSIS — M545 Low back pain, unspecified: Secondary | ICD-10-CM | POA: Diagnosis not present

## 2023-02-22 DIAGNOSIS — G8929 Other chronic pain: Secondary | ICD-10-CM | POA: Diagnosis not present

## 2023-02-25 DIAGNOSIS — G319 Degenerative disease of nervous system, unspecified: Secondary | ICD-10-CM | POA: Diagnosis not present

## 2023-02-25 DIAGNOSIS — I6782 Cerebral ischemia: Secondary | ICD-10-CM | POA: Diagnosis not present

## 2023-02-25 DIAGNOSIS — H21562 Pupillary abnormality, left eye: Secondary | ICD-10-CM | POA: Diagnosis not present

## 2023-02-25 DIAGNOSIS — K118 Other diseases of salivary glands: Secondary | ICD-10-CM | POA: Diagnosis not present

## 2023-03-27 IMAGING — CR DG CHEST 2V
2 series · 2 of 2 positions shown · non-contrast
Comparison: 10/25/2017

CLINICAL DATA: Chronic cough and shortness of breath. Hyponatremia.

EXAM:
CHEST - 2 VIEW

[w chest pa]
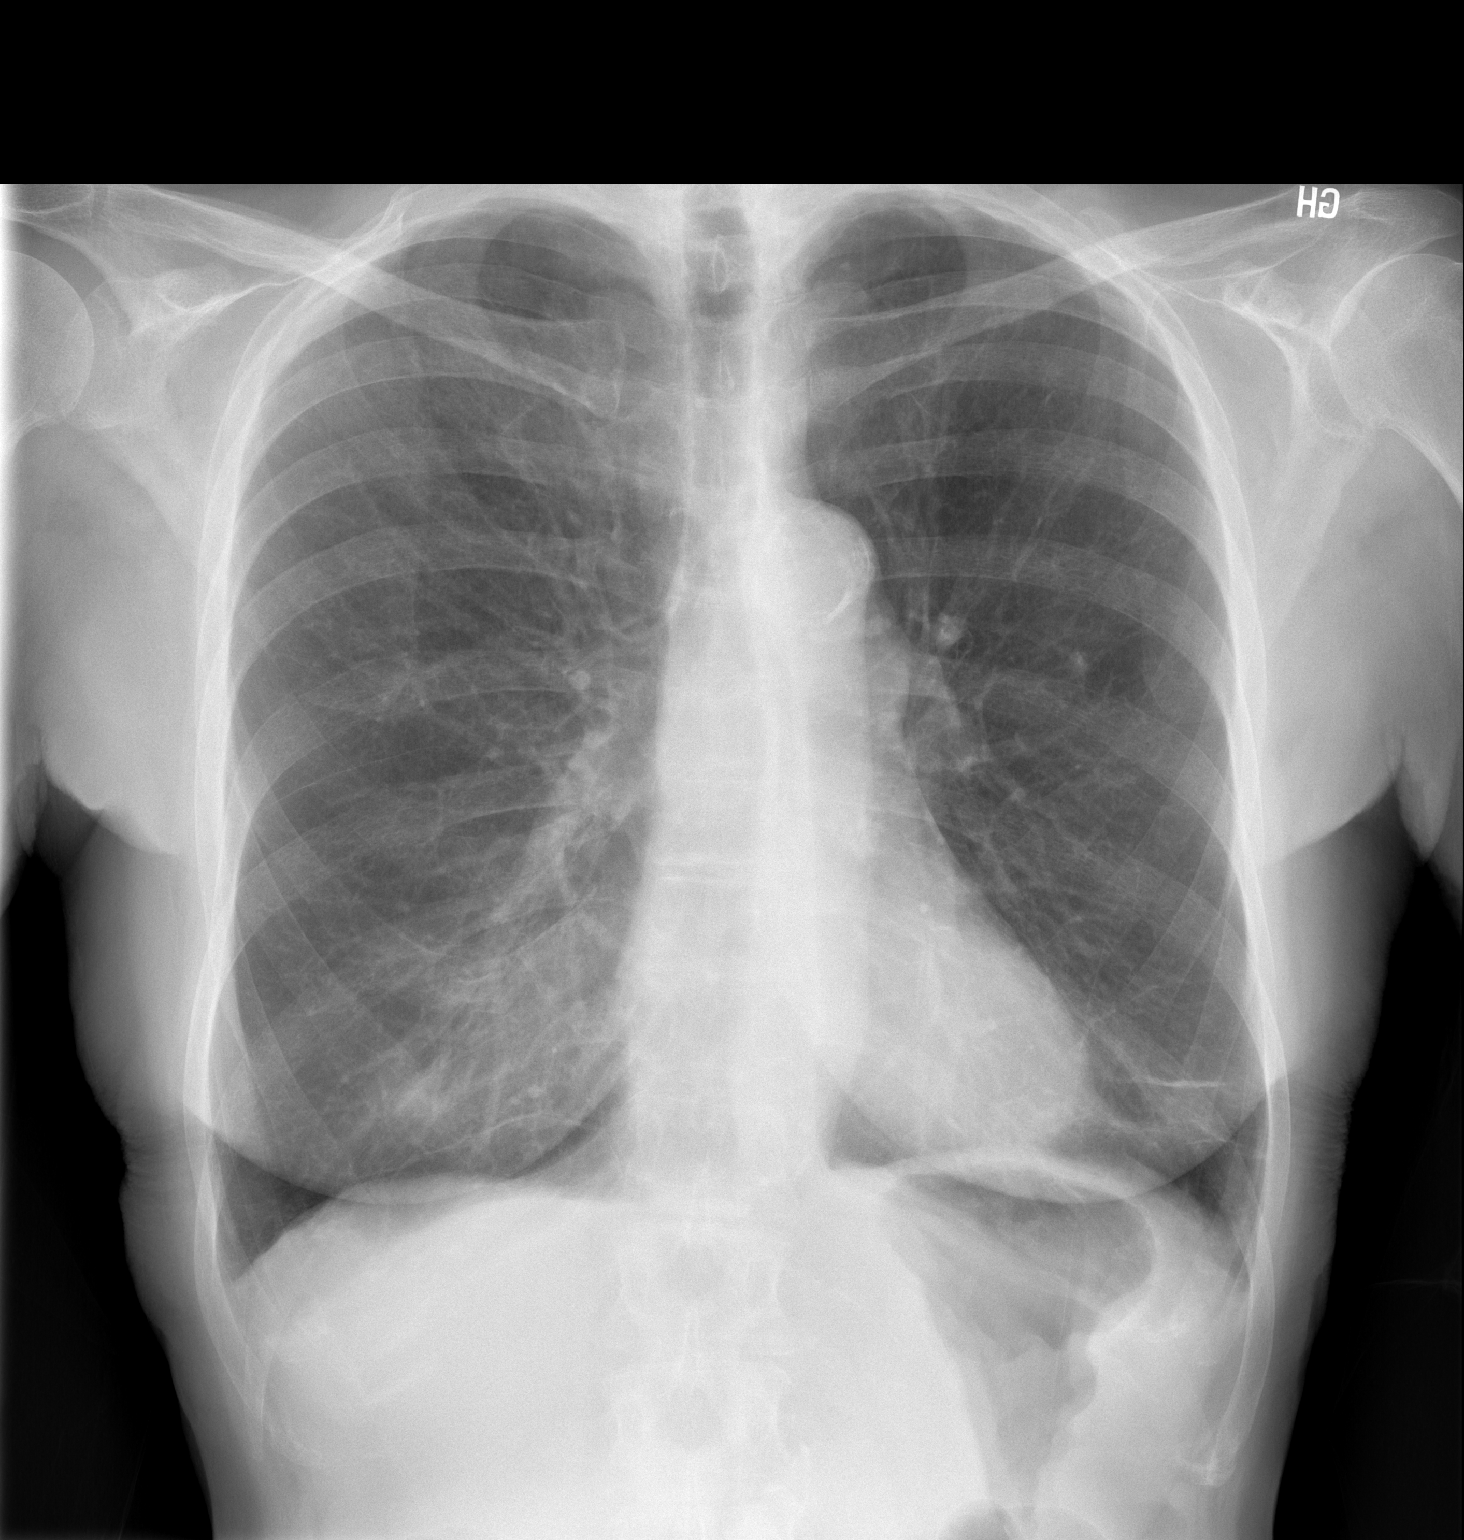

[w chest lat]
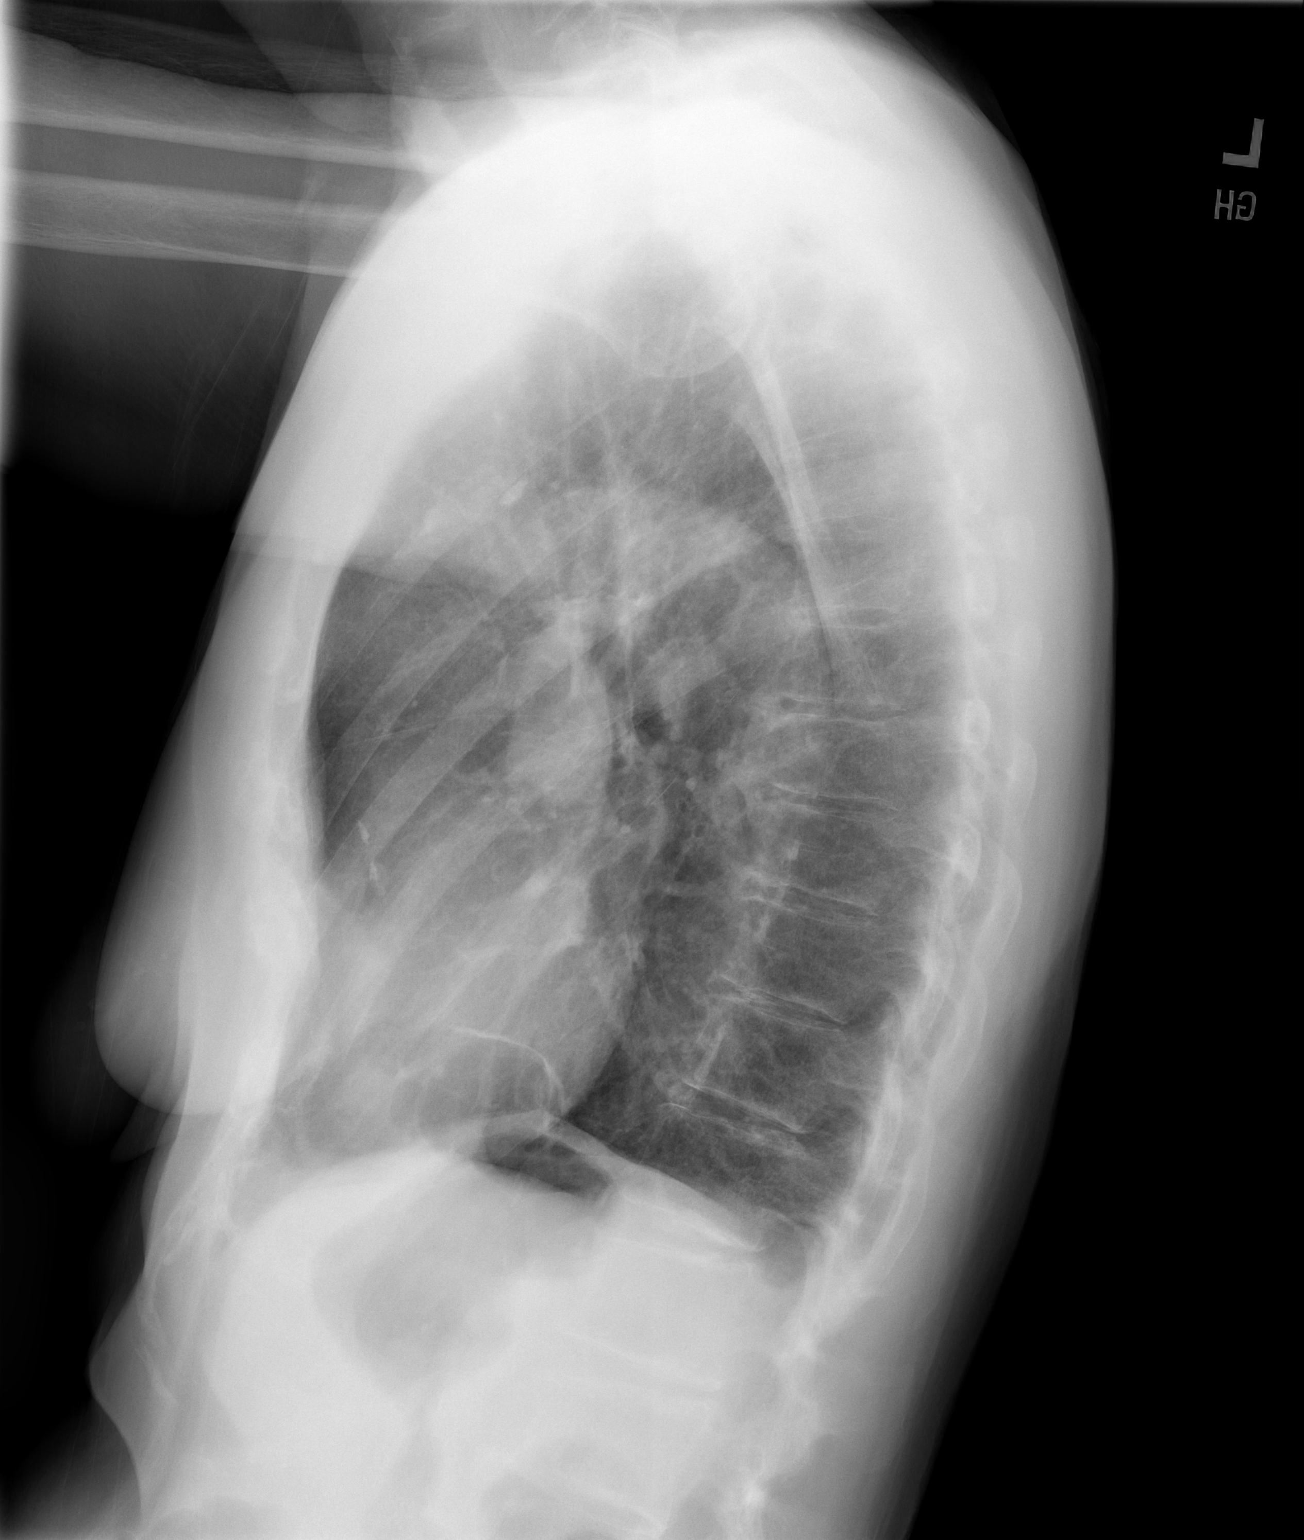

[2 of 2 positions shown; findings below may reference images not displayed]

FINDINGS: The heart size and mediastinal contours are within normal limits.
Aortic atherosclerotic calcification noted. Mild scarring is seen in
both lung bases. Mild hyperinflation is again seen. No evidence of
pulmonary infiltrate or pleural effusion.
IMPRESSION: Stable mild hyperinflation and bibasilar scarring. No active
cardiopulmonary disease.

## 2023-03-30 DIAGNOSIS — E7800 Pure hypercholesterolemia, unspecified: Secondary | ICD-10-CM | POA: Diagnosis not present

## 2023-03-30 DIAGNOSIS — F324 Major depressive disorder, single episode, in partial remission: Secondary | ICD-10-CM | POA: Diagnosis not present

## 2023-03-30 DIAGNOSIS — D649 Anemia, unspecified: Secondary | ICD-10-CM | POA: Diagnosis not present

## 2023-03-30 DIAGNOSIS — N183 Chronic kidney disease, stage 3 unspecified: Secondary | ICD-10-CM | POA: Diagnosis not present

## 2023-03-30 DIAGNOSIS — E559 Vitamin D deficiency, unspecified: Secondary | ICD-10-CM | POA: Diagnosis not present

## 2023-03-30 DIAGNOSIS — F411 Generalized anxiety disorder: Secondary | ICD-10-CM | POA: Diagnosis not present

## 2023-03-30 DIAGNOSIS — I129 Hypertensive chronic kidney disease with stage 1 through stage 4 chronic kidney disease, or unspecified chronic kidney disease: Secondary | ICD-10-CM | POA: Diagnosis not present

## 2023-03-30 DIAGNOSIS — K5909 Other constipation: Secondary | ICD-10-CM | POA: Diagnosis not present

## 2023-03-30 DIAGNOSIS — I700 Atherosclerosis of aorta: Secondary | ICD-10-CM | POA: Diagnosis not present

## 2023-03-30 DIAGNOSIS — I714 Abdominal aortic aneurysm, without rupture, unspecified: Secondary | ICD-10-CM | POA: Diagnosis not present

## 2023-03-30 DIAGNOSIS — N1831 Chronic kidney disease, stage 3a: Secondary | ICD-10-CM | POA: Diagnosis not present

## 2023-03-30 DIAGNOSIS — Z0000 Encounter for general adult medical examination without abnormal findings: Secondary | ICD-10-CM | POA: Diagnosis not present

## 2023-03-30 DIAGNOSIS — J449 Chronic obstructive pulmonary disease, unspecified: Secondary | ICD-10-CM | POA: Diagnosis not present

## 2023-04-19 DIAGNOSIS — F17210 Nicotine dependence, cigarettes, uncomplicated: Secondary | ICD-10-CM | POA: Diagnosis not present

## 2023-04-19 DIAGNOSIS — K118 Other diseases of salivary glands: Secondary | ICD-10-CM | POA: Diagnosis not present

## 2023-04-19 DIAGNOSIS — E041 Nontoxic single thyroid nodule: Secondary | ICD-10-CM | POA: Diagnosis not present

## 2023-04-20 DIAGNOSIS — M5451 Vertebrogenic low back pain: Secondary | ICD-10-CM | POA: Diagnosis not present

## 2023-04-23 NOTE — Progress Notes (Signed)
Fax received from [facility] on 04/20/23 for medical clearance/medication hold for basivertebral nerve ablation to be signed by B. Randie Heinz, MD.  Provider signed on 04/21/23, form faxed back to sender on 04/22/23, verified successful, sent to scan center.

## 2023-05-18 DIAGNOSIS — S42034A Nondisplaced fracture of lateral end of right clavicle, initial encounter for closed fracture: Secondary | ICD-10-CM | POA: Diagnosis not present

## 2023-05-18 DIAGNOSIS — W19XXXA Unspecified fall, initial encounter: Secondary | ICD-10-CM | POA: Diagnosis not present

## 2023-05-18 DIAGNOSIS — R918 Other nonspecific abnormal finding of lung field: Secondary | ICD-10-CM | POA: Diagnosis not present

## 2023-05-18 DIAGNOSIS — M79631 Pain in right forearm: Secondary | ICD-10-CM | POA: Diagnosis not present

## 2023-05-18 DIAGNOSIS — J9811 Atelectasis: Secondary | ICD-10-CM | POA: Diagnosis not present

## 2023-05-18 DIAGNOSIS — Z043 Encounter for examination and observation following other accident: Secondary | ICD-10-CM | POA: Diagnosis not present

## 2023-05-18 DIAGNOSIS — M47812 Spondylosis without myelopathy or radiculopathy, cervical region: Secondary | ICD-10-CM | POA: Diagnosis not present

## 2023-05-18 DIAGNOSIS — I6523 Occlusion and stenosis of bilateral carotid arteries: Secondary | ICD-10-CM | POA: Diagnosis not present

## 2023-05-18 DIAGNOSIS — S42001A Fracture of unspecified part of right clavicle, initial encounter for closed fracture: Secondary | ICD-10-CM | POA: Diagnosis not present

## 2023-05-18 DIAGNOSIS — R0781 Pleurodynia: Secondary | ICD-10-CM | POA: Diagnosis not present

## 2023-05-18 DIAGNOSIS — M79621 Pain in right upper arm: Secondary | ICD-10-CM | POA: Diagnosis not present

## 2023-05-18 DIAGNOSIS — M8580 Other specified disorders of bone density and structure, unspecified site: Secondary | ICD-10-CM | POA: Diagnosis not present

## 2023-05-28 DIAGNOSIS — S42031G Displaced fracture of lateral end of right clavicle, subsequent encounter for fracture with delayed healing: Secondary | ICD-10-CM | POA: Diagnosis not present

## 2023-05-28 DIAGNOSIS — T148XXA Other injury of unspecified body region, initial encounter: Secondary | ICD-10-CM | POA: Diagnosis not present

## 2023-05-28 DIAGNOSIS — R58 Hemorrhage, not elsewhere classified: Secondary | ICD-10-CM | POA: Diagnosis not present

## 2023-05-28 DIAGNOSIS — R296 Repeated falls: Secondary | ICD-10-CM | POA: Diagnosis not present

## 2023-06-22 DIAGNOSIS — Z01818 Encounter for other preprocedural examination: Secondary | ICD-10-CM | POA: Diagnosis not present

## 2023-06-22 DIAGNOSIS — N189 Chronic kidney disease, unspecified: Secondary | ICD-10-CM | POA: Diagnosis not present

## 2023-06-22 DIAGNOSIS — E871 Hypo-osmolality and hyponatremia: Secondary | ICD-10-CM | POA: Diagnosis not present

## 2023-06-22 DIAGNOSIS — D649 Anemia, unspecified: Secondary | ICD-10-CM | POA: Diagnosis not present

## 2023-06-22 DIAGNOSIS — J449 Chronic obstructive pulmonary disease, unspecified: Secondary | ICD-10-CM | POA: Diagnosis not present

## 2023-06-25 DIAGNOSIS — M5451 Vertebrogenic low back pain: Secondary | ICD-10-CM | POA: Diagnosis not present

## 2023-07-06 DIAGNOSIS — H25811 Combined forms of age-related cataract, right eye: Secondary | ICD-10-CM | POA: Diagnosis not present

## 2023-07-06 DIAGNOSIS — H2511 Age-related nuclear cataract, right eye: Secondary | ICD-10-CM | POA: Diagnosis not present

## 2023-07-15 DIAGNOSIS — M5451 Vertebrogenic low back pain: Secondary | ICD-10-CM | POA: Diagnosis not present

## 2023-07-15 DIAGNOSIS — M47816 Spondylosis without myelopathy or radiculopathy, lumbar region: Secondary | ICD-10-CM | POA: Diagnosis not present

## 2023-07-16 ENCOUNTER — Encounter (HOSPITAL_BASED_OUTPATIENT_CLINIC_OR_DEPARTMENT_OTHER): Payer: Self-pay

## 2023-07-16 ENCOUNTER — Telehealth: Payer: Self-pay

## 2023-07-16 ENCOUNTER — Emergency Department (HOSPITAL_BASED_OUTPATIENT_CLINIC_OR_DEPARTMENT_OTHER)
Admission: EM | Admit: 2023-07-16 | Discharge: 2023-07-16 | Discharge status: Against Medical Advice | Payer: Medicare HMO | Attending: Emergency Medicine | Admitting: Emergency Medicine

## 2023-07-16 ENCOUNTER — Other Ambulatory Visit: Payer: Self-pay

## 2023-07-16 DIAGNOSIS — Z5321 Procedure and treatment not carried out due to patient leaving prior to being seen by health care provider: Secondary | ICD-10-CM | POA: Diagnosis not present

## 2023-07-16 DIAGNOSIS — R109 Unspecified abdominal pain: Secondary | ICD-10-CM | POA: Diagnosis not present

## 2023-07-16 DIAGNOSIS — K921 Melena: Secondary | ICD-10-CM | POA: Diagnosis not present

## 2023-07-16 LAB — PROTIME-INR
INR: 1 (ref 0.8–1.2)
Prothrombin Time: 12.9 s (ref 11.4–15.2)

## 2023-07-16 LAB — CBC
HCT: 30.7 % — ABNORMAL LOW (ref 36.0–46.0)
Hemoglobin: 9.7 g/dL — ABNORMAL LOW (ref 12.0–15.0)
MCH: 25.4 pg — ABNORMAL LOW (ref 26.0–34.0)
MCHC: 31.6 g/dL (ref 30.0–36.0)
MCV: 80.4 fL (ref 80.0–100.0)
Platelets: 330 10*3/uL (ref 150–400)
RBC: 3.82 MIL/uL — ABNORMAL LOW (ref 3.87–5.11)
RDW: 13.9 % (ref 11.5–15.5)
WBC: 6.1 10*3/uL (ref 4.0–10.5)
nRBC: 0 % (ref 0.0–0.2)

## 2023-07-16 LAB — COMPREHENSIVE METABOLIC PANEL
ALT: 11 U/L (ref 0–44)
AST: 14 U/L — ABNORMAL LOW (ref 15–41)
Albumin: 3.8 g/dL (ref 3.5–5.0)
Alkaline Phosphatase: 120 U/L (ref 38–126)
Anion gap: 9 (ref 5–15)
BUN: 14 mg/dL (ref 8–23)
CO2: 20 mmol/L — ABNORMAL LOW (ref 22–32)
Calcium: 9.3 mg/dL (ref 8.9–10.3)
Chloride: 103 mmol/L (ref 98–111)
Creatinine, Ser: 1.09 mg/dL — ABNORMAL HIGH (ref 0.44–1.00)
GFR, Estimated: 54 mL/min — ABNORMAL LOW (ref >60)
Glucose, Bld: 95 mg/dL (ref 70–99)
Potassium: 3.7 mmol/L (ref 3.5–5.1)
Sodium: 132 mmol/L — ABNORMAL LOW (ref 135–145)
Total Bilirubin: 0.3 mg/dL (ref <1.2)
Total Protein: 6.6 g/dL (ref 6.5–8.1)

## 2023-07-16 LAB — URINALYSIS, MICROSCOPIC (REFLEX)

## 2023-07-16 LAB — URINALYSIS, ROUTINE W REFLEX MICROSCOPIC
Bilirubin Urine: NEGATIVE
Glucose, UA: NEGATIVE mg/dL
Ketones, ur: NEGATIVE mg/dL
Leukocytes,Ua: NEGATIVE
Nitrite: NEGATIVE
Protein, ur: NEGATIVE mg/dL
Specific Gravity, Urine: <=1.005 (ref 1.005–1.030)
pH: 6 (ref 5.0–8.0)

## 2023-07-16 LAB — ABO/RH: ABO/RH(D): B POS

## 2023-07-16 NOTE — ED Triage Notes (Signed)
The patient having bloody stool for three days. She is having abd pain.

## 2023-07-16 NOTE — Telephone Encounter (Signed)
Pt called with c/o hematuria x 3 days and feels like she has diarrhea "but nothing comes out". She said she is able to eat/drink and "do all the normal things". Her speech is slurred, which she said is from having several strokes over time. She states she did not know she was having them until they did an MRI. Pt has been advised to call her PCP to let them know about the hematuria. I called her back to confirm she was able to get in with them and they have advised her to report to the ED, since her PCP is away. Pt is planning to head there in a few hours, once her husband is home from work.

## 2023-07-16 NOTE — ED Notes (Signed)
The patient is also having blood in her urine

## 2023-07-19 DIAGNOSIS — H25812 Combined forms of age-related cataract, left eye: Secondary | ICD-10-CM | POA: Diagnosis not present

## 2023-07-21 ENCOUNTER — Other Ambulatory Visit: Payer: Self-pay | Admitting: Internal Medicine

## 2023-07-29 DIAGNOSIS — E559 Vitamin D deficiency, unspecified: Secondary | ICD-10-CM | POA: Diagnosis not present

## 2023-07-29 DIAGNOSIS — N183 Chronic kidney disease, stage 3 unspecified: Secondary | ICD-10-CM | POA: Diagnosis not present

## 2023-08-03 DIAGNOSIS — H2512 Age-related nuclear cataract, left eye: Secondary | ICD-10-CM | POA: Diagnosis not present

## 2023-08-03 DIAGNOSIS — H25812 Combined forms of age-related cataract, left eye: Secondary | ICD-10-CM | POA: Diagnosis not present

## 2023-08-17 ENCOUNTER — Encounter (HOSPITAL_COMMUNITY): Payer: Self-pay

## 2023-08-17 ENCOUNTER — Emergency Department (HOSPITAL_COMMUNITY)
Admission: EM | Admit: 2023-08-17 | Discharge: 2023-08-18 | Discharge status: Against Medical Advice | Payer: Medicare HMO | Attending: Emergency Medicine | Admitting: Emergency Medicine

## 2023-08-17 DIAGNOSIS — K625 Hemorrhage of anus and rectum: Secondary | ICD-10-CM | POA: Diagnosis not present

## 2023-08-17 DIAGNOSIS — R42 Dizziness and giddiness: Secondary | ICD-10-CM | POA: Insufficient documentation

## 2023-08-17 DIAGNOSIS — R0602 Shortness of breath: Secondary | ICD-10-CM | POA: Insufficient documentation

## 2023-08-17 DIAGNOSIS — R531 Weakness: Secondary | ICD-10-CM | POA: Insufficient documentation

## 2023-08-17 DIAGNOSIS — R110 Nausea: Secondary | ICD-10-CM | POA: Insufficient documentation

## 2023-08-17 DIAGNOSIS — Z7902 Long term (current) use of antithrombotics/antiplatelets: Secondary | ICD-10-CM | POA: Diagnosis not present

## 2023-08-17 DIAGNOSIS — R1084 Generalized abdominal pain: Secondary | ICD-10-CM | POA: Diagnosis not present

## 2023-08-17 DIAGNOSIS — Z5321 Procedure and treatment not carried out due to patient leaving prior to being seen by health care provider: Secondary | ICD-10-CM | POA: Insufficient documentation

## 2023-08-17 DIAGNOSIS — Z7901 Long term (current) use of anticoagulants: Secondary | ICD-10-CM | POA: Diagnosis not present

## 2023-08-17 DIAGNOSIS — R82998 Other abnormal findings in urine: Secondary | ICD-10-CM | POA: Insufficient documentation

## 2023-08-17 LAB — COMPREHENSIVE METABOLIC PANEL
ALT: 12 U/L (ref 0–44)
AST: 16 U/L (ref 15–41)
Albumin: 4 g/dL (ref 3.5–5.0)
Alkaline Phosphatase: 129 U/L — ABNORMAL HIGH (ref 38–126)
Anion gap: 10 (ref 5–15)
BUN: 13 mg/dL (ref 8–23)
CO2: 21 mmol/L — ABNORMAL LOW (ref 22–32)
Calcium: 9.7 mg/dL (ref 8.9–10.3)
Chloride: 99 mmol/L (ref 98–111)
Creatinine, Ser: 1.05 mg/dL — ABNORMAL HIGH (ref 0.44–1.00)
GFR, Estimated: 56 mL/min — ABNORMAL LOW (ref >60)
Glucose, Bld: 110 mg/dL — ABNORMAL HIGH (ref 70–99)
Potassium: 3.8 mmol/L (ref 3.5–5.1)
Sodium: 130 mmol/L — ABNORMAL LOW (ref 135–145)
Total Bilirubin: 0.5 mg/dL (ref 0.0–1.2)
Total Protein: 6.7 g/dL (ref 6.5–8.1)

## 2023-08-17 LAB — CBC WITH DIFFERENTIAL/PLATELET
Abs Immature Granulocytes: 0.05 10*3/uL (ref 0.00–0.07)
Basophils Absolute: 0.1 10*3/uL (ref 0.0–0.1)
Basophils Relative: 1 %
Eosinophils Absolute: 0.2 10*3/uL (ref 0.0–0.5)
Eosinophils Relative: 2 %
HCT: 27.2 % — ABNORMAL LOW (ref 36.0–46.0)
Hemoglobin: 8.3 g/dL — ABNORMAL LOW (ref 12.0–15.0)
Immature Granulocytes: 1 %
Lymphocytes Relative: 22 %
Lymphs Abs: 2.1 10*3/uL (ref 0.7–4.0)
MCH: 23.4 pg — ABNORMAL LOW (ref 26.0–34.0)
MCHC: 30.5 g/dL (ref 30.0–36.0)
MCV: 76.6 fL — ABNORMAL LOW (ref 80.0–100.0)
Monocytes Absolute: 1 10*3/uL (ref 0.1–1.0)
Monocytes Relative: 11 %
Neutro Abs: 5.9 10*3/uL (ref 1.7–7.7)
Neutrophils Relative %: 63 %
Platelets: 396 10*3/uL (ref 150–400)
RBC: 3.55 MIL/uL — ABNORMAL LOW (ref 3.87–5.11)
RDW: 14 % (ref 11.5–15.5)
WBC: 9.3 10*3/uL (ref 4.0–10.5)
nRBC: 0 % (ref 0.0–0.2)

## 2023-08-17 LAB — URINALYSIS, ROUTINE W REFLEX MICROSCOPIC
Bilirubin Urine: NEGATIVE
Glucose, UA: NEGATIVE mg/dL
Hgb urine dipstick: NEGATIVE
Ketones, ur: NEGATIVE mg/dL
Leukocytes,Ua: NEGATIVE
Nitrite: NEGATIVE
Protein, ur: NEGATIVE mg/dL
Specific Gravity, Urine: 1.003 — ABNORMAL LOW (ref 1.005–1.030)
pH: 5 (ref 5.0–8.0)

## 2023-08-17 LAB — LIPASE, BLOOD: Lipase: 36 U/L (ref 11–51)

## 2023-08-17 LAB — PROTIME-INR
INR: 1 (ref 0.8–1.2)
Prothrombin Time: 13.4 s (ref 11.4–15.2)

## 2023-08-17 LAB — TYPE AND SCREEN
ABO/RH(D): B POS
Antibody Screen: NEGATIVE

## 2023-08-17 NOTE — ED Triage Notes (Signed)
 Pt c/o bright red rectal bleeding x couple of weeks, worse since Sunday; pt on blood thinners; also endorses malodorous urine, generalized weakness, nausea, and sob; denies fevers, denies vomiting; also states has had several nerve ablations done for back pain without relief  Verbal consent given for mse

## 2023-08-17 NOTE — ED Provider Triage Note (Addendum)
 Emergency Medicine Provider Triage Evaluation Note  Andrea Little , a 73 y.o. female  was evaluated in triage.  Pt complains of BRBPR, dizziness, lightheadedness that started Sunday. She attempted to be evaluated on 07/16/23 but left prior to being seen by provider. She reports that episode ceased on its own but lasted one week. Also complains of generalized abd pain for years.  On plavix  following abdominal aortic stent   Review of Systems  Positive: Rectal bleeding, dizziness, lightheadedness Negative: fevers  Physical Exam  BP (!) 158/72   Pulse 65   Temp (!) 97.5 F (36.4 C) (Oral)   Resp 16   SpO2 100%  Gen:   Awake, no distress   Resp:  Normal effort  MSK:   Moves extremities without difficulty  Other:    Medical Decision Making  Medically screening exam initiated at 4:44 PM.  Appropriate orders placed.  Andrea Little was informed that the remainder of the evaluation will be completed by another provider, this initial triage assessment does not replace that evaluation, and the importance of remaining in the ED until their evaluation is complete.  Labs ordered   Andrea Tinnie BRAVO, PA 08/17/23 1648    Andrea Tinnie BRAVO, PA 08/17/23 [phone]

## 2023-08-18 NOTE — ED Notes (Signed)
 Patient left.

## 2023-09-10 DIAGNOSIS — H524 Presbyopia: Secondary | ICD-10-CM | POA: Diagnosis not present

## 2023-09-10 DIAGNOSIS — H52203 Unspecified astigmatism, bilateral: Secondary | ICD-10-CM | POA: Diagnosis not present

## 2023-09-15 DIAGNOSIS — R1310 Dysphagia, unspecified: Secondary | ICD-10-CM | POA: Diagnosis not present

## 2023-09-15 DIAGNOSIS — R195 Other fecal abnormalities: Secondary | ICD-10-CM | POA: Diagnosis not present

## 2023-09-15 DIAGNOSIS — Z8673 Personal history of transient ischemic attack (TIA), and cerebral infarction without residual deficits: Secondary | ICD-10-CM | POA: Diagnosis not present

## 2023-09-15 DIAGNOSIS — D649 Anemia, unspecified: Secondary | ICD-10-CM | POA: Diagnosis not present

## 2023-09-15 DIAGNOSIS — Z7901 Long term (current) use of anticoagulants: Secondary | ICD-10-CM | POA: Diagnosis not present

## 2023-09-15 DIAGNOSIS — K625 Hemorrhage of anus and rectum: Secondary | ICD-10-CM | POA: Diagnosis not present

## 2023-09-20 ENCOUNTER — Other Ambulatory Visit: Payer: Self-pay | Admitting: Physician Assistant

## 2023-09-20 DIAGNOSIS — H524 Presbyopia: Secondary | ICD-10-CM | POA: Diagnosis not present

## 2023-09-20 DIAGNOSIS — H52223 Regular astigmatism, bilateral: Secondary | ICD-10-CM | POA: Diagnosis not present

## 2023-09-22 ENCOUNTER — Other Ambulatory Visit: Payer: Self-pay | Admitting: Gastroenterology

## 2023-09-24 ENCOUNTER — Telehealth: Payer: Self-pay | Admitting: Internal Medicine

## 2023-09-24 NOTE — Telephone Encounter (Signed)
   Pre-operative Risk Assessment    Patient Name: Andrea Little  DOB: [birth year] MRN: [id number]   Date of last office visit: 09/10/2022 Date of next office visit: none   Request for Surgical Clearance    Procedure:   colonoscopy/endoscopy  Date of Surgery:  Clearance 11/09/23                                Surgeon:  Dr. Kerin Salen Surgeon's Group or Practice Name:  [facility] Phone number:  [phone] Fax number:  [phone]   Type of Clearance Requested:   - Medical  - Pharmacy:  Hold Aspirin and Clopidogrel (Plavix) Instructions   Type of Anesthesia:   Propofol   Additional requests/questions:    Sharen Hones   09/24/2023, 8:24 AM

## 2023-09-24 NOTE — Telephone Encounter (Signed)
   Name: Andrea Little  DOB: 1950-11-09  MRN: [id number]  Primary Cardiologist: None  Chart reviewed as part of pre-operative protocol coverage. Because of Andrea Little's past medical history and time since last visit, she will require a follow-up in-office visit in order to better assess preoperative cardiovascular risk.  Pre-op covering staff: - Please schedule appointment and call patient to inform them. If patient already had an upcoming appointment within acceptable timeframe, please add "pre-op clearance" to the appointment notes so provider is aware. - Please contact requesting surgeon's office via preferred method (i.e, phone, fax) to inform them of need for appointment prior to surgery.   Plavix and aspirin are prescribed by vascular.  Will reach out to them for guidance on holding parameters.   Sharlene Dory, PA-C  09/24/2023, 8:31 AM

## 2023-09-24 NOTE — Telephone Encounter (Signed)
 Patient has been schedule for in person appt for preop clearance

## 2023-10-21 ENCOUNTER — Other Ambulatory Visit: Payer: Self-pay | Admitting: Vascular Surgery

## 2023-10-25 ENCOUNTER — Encounter: Payer: Self-pay | Admitting: Physician Assistant

## 2023-10-25 ENCOUNTER — Ambulatory Visit: Payer: Medicare HMO | Attending: Physician Assistant | Admitting: Physician Assistant

## 2023-10-25 VITALS — BP 124/66 | HR 63 | Ht 68.0 in | Wt 152.0 lb

## 2023-10-25 DIAGNOSIS — R0602 Shortness of breath: Secondary | ICD-10-CM

## 2023-10-25 DIAGNOSIS — E785 Hyperlipidemia, unspecified: Secondary | ICD-10-CM

## 2023-10-25 DIAGNOSIS — R0609 Other forms of dyspnea: Secondary | ICD-10-CM

## 2023-10-25 DIAGNOSIS — I2510 Atherosclerotic heart disease of native coronary artery without angina pectoris: Secondary | ICD-10-CM

## 2023-10-25 DIAGNOSIS — I10 Essential (primary) hypertension: Secondary | ICD-10-CM | POA: Diagnosis not present

## 2023-10-25 DIAGNOSIS — Z01818 Encounter for other preprocedural examination: Secondary | ICD-10-CM | POA: Diagnosis not present

## 2023-10-25 NOTE — Patient Instructions (Addendum)
 Medication Instructions:  Your physician recommends that you continue on your current medications as directed. Please refer to the Current Medication list given to you today.  *If you need a refill on your cardiac medications before your next appointment, please call your pharmacy*   Lab Work: NONE If you have labs (blood work) drawn today and your tests are completely normal, you will receive your results only by: MyChart Message (if you have MyChart) OR A paper copy in the mail If you have any lab test that is abnormal or we need to change your treatment, we will call you to review the results.   Testing/Procedures: Echocardiogram Your physician has requested that you have an echocardiogram. Echocardiography is a painless test that uses sound waves to create images of your heart. It provides your doctor with information about the size and shape of your heart and how well your heart's chambers and valves are working. This procedure takes approximately one hour. There are no restrictions for this procedure. Please do NOT wear cologne, perfume, aftershave, or lotions (deodorant is allowed). Please arrive 15 minutes prior to your appointment time.  Please note: We ask at that you not bring children with you during ultrasound (echo/ vascular) testing. Due to room size and safety concerns, children are not allowed in the ultrasound rooms during exams. Our front office staff cannot provide observation of children in our lobby area while testing is being conducted. An adult accompanying a patient to their appointment will only be allowed in the ultrasound room at the discretion of the ultrasound technician under special circumstances. We apologize for any inconvenience.   Follow-Up: At [facility], you and your health needs are our priority.  As part of our continuing mission to provide you with exceptional heart care, we have created designated Provider Care Teams.  These Care Teams  include your primary Cardiologist (physician) and Advanced Practice Providers (APPs -  Physician Assistants and Nurse Practitioners) who all work together to provide you with the care you need, when you need it.  We recommend signing up for the patient portal called "MyChart".  Sign up information is provided on this After Visit Summary.  MyChart is used to connect with patients for Virtual Visits (Telemedicine).  Patients are able to view lab/test results, encounter notes, upcoming appointments, etc.  Non-urgent messages can be sent to your provider as well.   To learn more about what you can do with MyChart, go to [email or web address].    Your next appointment:   6 months  Provider:   Dr. Tenny Craw  Other Instructions   1st Floor: - Lobby - Registration  - Pharmacy  - Lab - Cafe  2nd Floor: - PV Lab - Diagnostic Testing (echo, CT, nuclear med)  3rd Floor: - Vacant  4th Floor: - TCTS (cardiothoracic surgery) - AFib Clinic - Structural Heart Clinic - Vascular Surgery  - Vascular Ultrasound  5th Floor: - HeartCare Cardiology (general and EP) - Clinical Pharmacy for coumadin, hypertension, lipid, weight-loss medications, and med management appointments    Valet parking services will be available as well.

## 2023-10-25 NOTE — Progress Notes (Signed)
 Cardiology Office Note:  .   Date:  10/25/2023  ID:  Andrea Little, DOB [birth year], MRN [id number] PCP: Ollen Bowl, MD  [facility] Providers Cardiologist:  None {  History of Present Illness: .   Andrea Little is a 73 y.o. 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   She denies CP and gets SOB throughout with minimal activity. Does endorse minimal back pain (due to ablation next week). No palpitations and no LE edema.  Today, she presents for preop clearance. The patient, with a history of aortic surgery, is scheduled for a colonoscopy due to blood in the stool. The patient is on blood thinners, Plavix and aspirin, prescribed by a vascular surgeon. The patient has been experiencing chronic shortness of breath, but there has been no change in the severity of the symptoms. The patient has severe mobility issues due to chronic back pain and has been unable to participate in physical therapy. The patient fell recently due to loss of balance. The patient also reports constipation alternating with diarrhea. The patient is a smoker, currently smoking about 15 cigarettes a day. Last hemoglobin 8.3.  Reports no shortness of breath nor dyspnea on exertion. Reports no chest pain, pressure, or tightness. No edema, orthopnea, PND. Reports no palpitations.   Discussed the use of AI scribe software for clinical note transcription with the patient, who gave verbal consent to proceed.    ROS: pertinent Ros in HPI  Studies Reviewed: .       Echo 09/24/22 IMPRESSIONS     1. Left ventricular ejection fraction, by estimation, is 60 to 65%. The  left ventricle has normal function. The left ventricle has no regional  wall motion abnormalities. There is mild left ventricular hypertrophy.  Left ventricular diastolic parameters  are consistent with Grade I diastolic dysfunction (impaired relaxation).   2. Right ventricular systolic  function is normal. The right ventricular  size is normal.   3. The mitral valve is normal in structure. Trivial mitral valve  regurgitation. No evidence of mitral stenosis.   4. The aortic valve is tricuspid. Aortic valve regurgitation is trivial.  Aortic valve sclerosis is present, with no evidence of aortic valve  stenosis.   5. The inferior vena cava is normal in size with greater than 50%  respiratory variability, suggesting right atrial pressure of 3 mmHg.   Comparison(s): No prior Echocardiogram.   FINDINGS   Left Ventricle: Left ventricular ejection fraction, by estimation, is 60  to 65%. The left ventricle has normal function. The left ventricle has no  regional wall motion abnormalities. The left ventricular internal cavity  size was normal in size. There is   mild left ventricular hypertrophy. Left ventricular diastolic parameters  are consistent with Grade I diastolic dysfunction (impaired relaxation).   Right Ventricle: The right ventricular size is normal. Right ventricular  systolic function is normal.   Left Atrium: Left atrial size was normal in size.   Right Atrium: Right atrial size was normal in size.   Pericardium: There is no evidence of pericardial effusion.   Mitral Valve: The mitral valve is normal in structure. Trivial mitral  valve regurgitation. No evidence of mitral valve stenosis.   Tricuspid Valve: The tricuspid valve is normal in structure. Tricuspid  valve regurgitation is trivial. No evidence of tricuspid stenosis.   Aortic Valve: The aortic valve is tricuspid. Aortic valve regurgitation is  trivial. Aortic valve  sclerosis is present, with no evidence of aortic  valve stenosis.   Pulmonic Valve: The pulmonic valve was normal in structure. Pulmonic valve  regurgitation is not visualized. No evidence of pulmonic stenosis.   Aorta: The aortic root is normal in size and structure.   Venous: The inferior vena cava is normal in size with greater  than 50%  respiratory variability, suggesting right atrial pressure of 3 mmHg.   IAS/Shunts: No atrial level shunt detected by color flow Doppler.   Physical Exam:   VS:  BP 124/66   Pulse 63   Ht 5\' 8"  (1.727 m)   Wt 152 lb (68.9 kg)   SpO2 99%   BMI 23.11 kg/m    Wt Readings from Last 3 Encounters:  10/25/23 152 lb (68.9 kg)  07/16/23 160 lb 15 oz (73 kg)  11/18/22 161 lb (73 kg)    GEN: Well nourished, well developed in no acute distress NECK: No JVD; No carotid bruits CARDIAC: RRR, no murmurs, rubs, gallops RESPIRATORY:  Clear to auscultation without rales, wheezing or rhonchi  ABDOMEN: Soft, non-tender, non-distended EXTREMITIES:  No edema; No deformity   ASSESSMENT AND PLAN: .   Preop clearance  Ms. Doring's perioperative risk of a major cardiac event is 0.9% according to the Revised Cardiac Risk Index (RCRI).  Therefore, she is at low risk for perioperative complications.   Her functional capacity is poor at 3.63 METs according to the Duke Activity Status Index (DASI). Recommendations: The patient requires an echocardiogram before a disposition can be made regarding surgical risk.                We were able to schedule her echo for 4/17, so her procedure will need to be delayed.  Antiplatelet and/or Anticoagulation Recommendations: ASA and plavix prescribed by vascular surgery.  Aortic surgery follow-up Post-aortic surgery on October 09, 2022. Discussion on holding blood thinners for upcoming colonoscopy. Preference to continue aspirin if possible, contingent on biopsy plans. - Consult with Dr. Randie Heinz regarding continuation of Plavix and aspirin for colonoscopy. - Document cardiology perspective: acceptable to hold Plavix and aspirin for colonoscopy, prefer to continue aspirin if possible.  Shortness of breath and new heart murmur Chronic dyspnea with new heart murmur. Previous ultrasound showed normal cardiac function. Differential includes cardiac issues versus back pain  and lack of therapy. Echocardiogram needed to assess cardiac function and evaluate murmur. - Order echocardiogram to assess cardiac function. - Coordinate echocardiogram during inpatient stay for colonoscopy if possible.  Gastrointestinal bleeding Bright red blood per rectum, leading to multiple ED visits. Colonoscopy scheduled for November 09, 2023, to determine bleeding source. Alternating constipation and diarrhea, possibly related to gastrointestinal issues. - Conduct preoperative labs to check hemoglobin levels.  Back pain and debilitation Chronic back pain with multiple procedures over four years. Increased weakness post-procedure. No physical therapy post-procedure due to logistical issues. Significant impact on mobility and daily activities, requiring walker for mobility. - Discuss potential for home-based physical therapy.  Tobacco use disorder Long-term smoking, currently 15 cigarettes per day. Difficulty quitting due to long-term use. Smoking cessation could improve respiratory issues. - Encourage gradual reduction in smoking over the next several months.  Follow-up Coordination of care and follow-up appointments discussed. Emphasis on minimizing travel for debilitated patient. - Schedule echocardiogram and coordinate with hospital admission for colonoscopy. - Provide follow-up appointment details and medication list before discharge.      Dispo: 6 months with Dr. Tenny Craw  Signed, Sharlene Dory, PA-C

## 2023-11-01 ENCOUNTER — Observation Stay (HOSPITAL_BASED_OUTPATIENT_CLINIC_OR_DEPARTMENT_OTHER)
Admission: EM | Admit: 2023-11-01 | Discharge: 2023-11-02 | Disposition: A | Discharge status: Home Health | Attending: Family Medicine | Admitting: Family Medicine

## 2023-11-01 ENCOUNTER — Other Ambulatory Visit: Payer: Self-pay

## 2023-11-01 DIAGNOSIS — J449 Chronic obstructive pulmonary disease, unspecified: Secondary | ICD-10-CM | POA: Insufficient documentation

## 2023-11-01 DIAGNOSIS — F17210 Nicotine dependence, cigarettes, uncomplicated: Secondary | ICD-10-CM | POA: Diagnosis not present

## 2023-11-01 DIAGNOSIS — Z79899 Other long term (current) drug therapy: Secondary | ICD-10-CM | POA: Diagnosis not present

## 2023-11-01 DIAGNOSIS — R531 Weakness: Secondary | ICD-10-CM | POA: Diagnosis present

## 2023-11-01 DIAGNOSIS — Z7902 Long term (current) use of antithrombotics/antiplatelets: Secondary | ICD-10-CM | POA: Insufficient documentation

## 2023-11-01 DIAGNOSIS — I739 Peripheral vascular disease, unspecified: Secondary | ICD-10-CM | POA: Diagnosis not present

## 2023-11-01 DIAGNOSIS — F419 Anxiety disorder, unspecified: Secondary | ICD-10-CM | POA: Diagnosis not present

## 2023-11-01 DIAGNOSIS — I129 Hypertensive chronic kidney disease with stage 1 through stage 4 chronic kidney disease, or unspecified chronic kidney disease: Secondary | ICD-10-CM | POA: Diagnosis not present

## 2023-11-01 DIAGNOSIS — E871 Hypo-osmolality and hyponatremia: Secondary | ICD-10-CM | POA: Insufficient documentation

## 2023-11-01 DIAGNOSIS — E785 Hyperlipidemia, unspecified: Secondary | ICD-10-CM | POA: Insufficient documentation

## 2023-11-01 DIAGNOSIS — N1831 Chronic kidney disease, stage 3a: Secondary | ICD-10-CM | POA: Diagnosis not present

## 2023-11-01 DIAGNOSIS — D649 Anemia, unspecified: Secondary | ICD-10-CM | POA: Diagnosis not present

## 2023-11-01 DIAGNOSIS — K921 Melena: Secondary | ICD-10-CM | POA: Diagnosis present

## 2023-11-01 DIAGNOSIS — Z7982 Long term (current) use of aspirin: Secondary | ICD-10-CM | POA: Diagnosis not present

## 2023-11-01 DIAGNOSIS — N183 Chronic kidney disease, stage 3 unspecified: Secondary | ICD-10-CM | POA: Diagnosis not present

## 2023-11-01 DIAGNOSIS — D62 Acute posthemorrhagic anemia: Secondary | ICD-10-CM | POA: Diagnosis not present

## 2023-11-01 DIAGNOSIS — Z85828 Personal history of other malignant neoplasm of skin: Secondary | ICD-10-CM | POA: Diagnosis not present

## 2023-11-01 LAB — URINALYSIS, ROUTINE W REFLEX MICROSCOPIC
Bilirubin Urine: NEGATIVE
Glucose, UA: NEGATIVE mg/dL
Hgb urine dipstick: NEGATIVE
Ketones, ur: NEGATIVE mg/dL
Leukocytes,Ua: NEGATIVE
Nitrite: NEGATIVE
Protein, ur: NEGATIVE mg/dL
Specific Gravity, Urine: 1.002 — ABNORMAL LOW (ref 1.005–1.030)
pH: 6 (ref 5.0–8.0)

## 2023-11-01 LAB — CBC WITH DIFFERENTIAL/PLATELET
Abs Immature Granulocytes: 0.02 10*3/uL (ref 0.00–0.07)
Basophils Absolute: 0.1 10*3/uL (ref 0.0–0.1)
Basophils Relative: 1 %
Eosinophils Absolute: 0.2 10*3/uL (ref 0.0–0.5)
Eosinophils Relative: 2 %
HCT: 23.9 % — ABNORMAL LOW (ref 36.0–46.0)
Hemoglobin: 6.7 g/dL — CL (ref 12.0–15.0)
Immature Granulocytes: 0 %
Lymphocytes Relative: 24 %
Lymphs Abs: 2 10*3/uL (ref 0.7–4.0)
MCH: 18 pg — ABNORMAL LOW (ref 26.0–34.0)
MCHC: 28 g/dL — ABNORMAL LOW (ref 30.0–36.0)
MCV: 64.1 fL — ABNORMAL LOW (ref 80.0–100.0)
Monocytes Absolute: 0.9 10*3/uL (ref 0.1–1.0)
Monocytes Relative: 11 %
Neutro Abs: 5.1 10*3/uL (ref 1.7–7.7)
Neutrophils Relative %: 62 %
Platelets: 462 10*3/uL — ABNORMAL HIGH (ref 150–400)
RBC: 3.73 MIL/uL — ABNORMAL LOW (ref 3.87–5.11)
RDW: 17.2 % — ABNORMAL HIGH (ref 11.5–15.5)
WBC: 8.3 10*3/uL (ref 4.0–10.5)
nRBC: 0 % (ref 0.0–0.2)

## 2023-11-01 LAB — BASIC METABOLIC PANEL WITH GFR
Anion gap: 9 (ref 5–15)
BUN: 14 mg/dL (ref 8–23)
CO2: 21 mmol/L — ABNORMAL LOW (ref 22–32)
Calcium: 9.1 mg/dL (ref 8.9–10.3)
Chloride: 96 mmol/L — ABNORMAL LOW (ref 98–111)
Creatinine, Ser: 1.03 mg/dL — ABNORMAL HIGH (ref 0.44–1.00)
GFR, Estimated: 57 mL/min — ABNORMAL LOW (ref >60)
Glucose, Bld: 79 mg/dL (ref 70–99)
Potassium: 3.8 mmol/L (ref 3.5–5.1)
Sodium: 126 mmol/L — ABNORMAL LOW (ref 135–145)

## 2023-11-01 LAB — COMPREHENSIVE METABOLIC PANEL WITH GFR
ALT: 10 U/L (ref 0–44)
AST: 12 U/L — ABNORMAL LOW (ref 15–41)
Albumin: 4.4 g/dL (ref 3.5–5.0)
Alkaline Phosphatase: 104 U/L (ref 38–126)
Anion gap: 10 (ref 5–15)
BUN: 14 mg/dL (ref 8–23)
CO2: 21 mmol/L — ABNORMAL LOW (ref 22–32)
Calcium: 9.2 mg/dL (ref 8.9–10.3)
Chloride: 93 mmol/L — ABNORMAL LOW (ref 98–111)
Creatinine, Ser: 1.1 mg/dL — ABNORMAL HIGH (ref 0.44–1.00)
GFR, Estimated: 53 mL/min — ABNORMAL LOW (ref >60)
Glucose, Bld: 95 mg/dL (ref 70–99)
Potassium: 4 mmol/L (ref 3.5–5.1)
Sodium: 124 mmol/L — ABNORMAL LOW (ref 135–145)
Total Bilirubin: 0.3 mg/dL (ref 0.0–1.2)
Total Protein: 7 g/dL (ref 6.5–8.1)

## 2023-11-01 LAB — SODIUM, URINE, RANDOM: Sodium, Ur: 17 mmol/L

## 2023-11-01 LAB — OSMOLALITY: Osmolality: 268 mosm/kg — ABNORMAL LOW (ref 275–295)

## 2023-11-01 LAB — PREPARE RBC (CROSSMATCH)

## 2023-11-01 LAB — OCCULT BLOOD X 1 CARD TO LAB, STOOL: Fecal Occult Bld: NEGATIVE

## 2023-11-01 MED ORDER — ACETAMINOPHEN 325 MG PO TABS: 650.0000 mg | ORAL_TABLET | Freq: Four times a day (QID) | ORAL | Status: DC | PRN

## 2023-11-01 MED ORDER — OXYCODONE HCL 5 MG PO TABS: 5.0000 mg | ORAL_TABLET | ORAL | Status: DC | PRN

## 2023-11-01 MED ORDER — SODIUM CHLORIDE 0.9 % IV BOLUS
1000.0000 mL | Freq: Once | INTRAVENOUS | Status: AC
  Administered 2023-11-01: 1000 mL via INTRAVENOUS

## 2023-11-01 MED ORDER — SODIUM CHLORIDE 0.9 % IV SOLN: INTRAVENOUS | Status: DC

## 2023-11-01 MED ORDER — BENAZEPRIL HCL 20 MG PO TABS
40.0000 mg | ORAL_TABLET | Freq: Every day | ORAL | Status: DC
  Administered 2023-11-02: 40 mg via ORAL
  Filled 2023-11-01: qty 2

## 2023-11-01 MED ORDER — ASPIRIN 81 MG PO TBEC
81.0000 mg | DELAYED_RELEASE_TABLET | Freq: Every day | ORAL | Status: DC
  Administered 2023-11-02: 81 mg via ORAL
  Filled 2023-11-01: qty 1

## 2023-11-01 MED ORDER — ONDANSETRON HCL 4 MG/2ML IJ SOLN: 4.0000 mg | Freq: Four times a day (QID) | INTRAMUSCULAR | Status: DC | PRN

## 2023-11-01 MED ORDER — PEG 3350-KCL-NA BICARB-NACL 420 G PO SOLR
4000.0000 mL | Freq: Once | ORAL | Status: AC
  Administered 2023-11-01: 4000 mL via ORAL

## 2023-11-01 MED ORDER — ACETAMINOPHEN 650 MG RE SUPP: 650.0000 mg | Freq: Four times a day (QID) | RECTAL | Status: DC | PRN

## 2023-11-01 MED ORDER — EZETIMIBE 10 MG PO TABS
10.0000 mg | ORAL_TABLET | Freq: Every day | ORAL | Status: DC
Start: 2023-11-02 — End: 2023-11-02
  Administered 2023-11-02: 10 mg via ORAL
  Filled 2023-11-01: qty 1

## 2023-11-01 MED ORDER — ALPRAZOLAM 0.5 MG PO TABS
1.0000 mg | ORAL_TABLET | Freq: Three times a day (TID) | ORAL | Status: DC
  Administered 2023-11-01 – 2023-11-02 (×2): 1 mg via ORAL
  Filled 2023-11-01 (×2): qty 2

## 2023-11-01 MED ORDER — VENLAFAXINE HCL ER 150 MG PO CP24
150.0000 mg | ORAL_CAPSULE | Freq: Every day | ORAL | Status: DC
  Administered 2023-11-02: 150 mg via ORAL
  Filled 2023-11-01: qty 1

## 2023-11-01 MED ORDER — ONDANSETRON HCL 4 MG PO TABS
4.0000 mg | ORAL_TABLET | Freq: Four times a day (QID) | ORAL | Status: DC | PRN
Start: 2023-11-01 — End: 2023-11-02

## 2023-11-01 MED ORDER — AMLODIPINE BESYLATE 5 MG PO TABS
5.0000 mg | ORAL_TABLET | Freq: Two times a day (BID) | ORAL | Status: DC
  Administered 2023-11-01 – 2023-11-02 (×2): 5 mg via ORAL
  Filled 2023-11-01 (×2): qty 1

## 2023-11-01 MED ORDER — ATENOLOL 50 MG PO TABS
100.0000 mg | ORAL_TABLET | Freq: Every day | ORAL | Status: DC
  Administered 2023-11-02: 100 mg via ORAL
  Filled 2023-11-01: qty 2

## 2023-11-01 MED ORDER — SODIUM CHLORIDE 0.9% IV SOLUTION: Freq: Once | INTRAVENOUS | Status: DC

## 2023-11-01 NOTE — ED Provider Triage Note (Signed)
 Emergency Medicine Provider Triage Evaluation Note  Andrea Little , a 73 y.o. female  was evaluated in triageloiw.  Pt complains of hgb 6.7 hx of gi bleed on plavix   Review of Systems  Positive: Low hgb Negative: fever  Physical Exam  There were no vitals taken for this visit. Gen:   Awake, no distress   Resp:  Normal effort  MSK:   Moves extremities without difficulty  Other:    Medical Decision Making  Medically screening exam initiated at 6:13 PM.  Appropriate orders placed.  Andrea Little was informed that the remainder of the evaluation will be completed by another provider, this initial triage assessment does not replace that evaluation, and the importance of remaining in the ED until their evaluation is complete.    Arthor Captain, PA-C 11/01/23 [phone]

## 2023-11-01 NOTE — H&P (Signed)
 History and Physical    Antonique Langford UJW:[id number] DOB: June 29, 1951 DOA: 11/01/2023  PCP: Ollen Bowl, MD   Chief Complaint:  weakness  HPI: Andrea Little is a 73 y.o. female with medical history significant of COPD, CKD stage IIIa, peripheral vascular disease on dual antiplatelet therapy who presents emerged part with a fall.  Patient states she has frequent falls and is felt especially lightheaded.  She went to her family medicine doctor for further assessment and she was found to have a hemoglobin 6.7.  She has a history of recurrent GI bleeding.  She denied any overt bright red blood per rectum.  On arrival to the emergency department she was afebrile hemodynamically stable.  She was found to be hypoxic.  Baseline hemoglobin around 9.  She was scheduled to have a colonoscopy in the coming days.  CT abdomen pelvis was obtained which showed no evidence of GI bleeding.  Due to patient being at [facility] with limited blood supply she was transferred to [facility] for further management.  GI was consulted and would like to perform colonoscopy in the morning.  On evaluation she denied overt GI blood loss.  Her last endoscopic evaluation was 5 years ago which was normal. She states that she has had a couple episodes of BRBPR last being in November. She denies taking NSAIDS. No black stools.    Review of Systems: Review of Systems  Constitutional:  Negative for chills and fever.  HENT: Negative.    Eyes: Negative.   Respiratory: Negative.    Cardiovascular: Negative.   Gastrointestinal: Negative.   Genitourinary: Negative.   Musculoskeletal: Negative.   Skin: Negative.   Neurological: Negative.   Endo/Heme/Allergies: Negative.   Psychiatric/Behavioral: Negative.    All other systems reviewed and are negative.    As per HPI otherwise 10 point review of systems negative.   Allergies  Allergen Reactions   Atorvastatin     Other reaction(s): leg cramps   Hydrochlorothiazide     Other  reaction(s): hyponatremia   Penicillin G     Other reaction(s): Other (See Comments) As a child does not recall the reaction    Rosuvastatin     Other Reaction(s): muscle aches    Past Medical History:  Diagnosis Date   AAA (abdominal aortic aneurysm) (HCC)    Anxiety    Arthritis    Back pain    Cancer (HCC)    skin   Chronic kidney disease    COPD (chronic obstructive pulmonary disease) (HCC)    Dyspnea    Hyperlipidemia    Hypertension    Major depression    Mood disorder (HCC)    Pneumonia 1984   Tremor     Past Surgical History:  Procedure Laterality Date   ABDOMINAL AORTIC ENDOVASCULAR STENT GRAFT N/A 10/09/2022   Procedure: ABDOMINAL AORTIC ENDOVASCULAR STENT GRAFT;  Surgeon: Maeola Harman, MD;  Location[facility] OR;  Service: Vascular;  Laterality: N/A;   ULTRASOUND GUIDANCE FOR VASCULAR ACCESS Bilateral 10/09/2022   Procedure: ULTRASOUND GUIDANCE FOR VASCULAR ACCESS, BILATERAL FEMORAL ARTERIES;  Surgeon: Maeola Harman, MD;  Location: [facility] OR;  Service: Vascular;  Laterality: Bilateral;     reports that she has been smoking cigarettes. She has never used smokeless tobacco. She reports that she does not currently use alcohol. She reports that she does not use drugs.  No family history on file.  Prior to Admission medications   Medication Sig Start Date End Date Taking? Authorizing Provider  ALPRAZolam (  XANAX) 1 MG tablet Take 1 mg by mouth 3 (three) times daily. 09/26/20   [provider]  amLODipine (NORVASC) 5 MG tablet Take 1 tablet (5 mg total) by mouth 2 (two) times daily. Please call [phone] to schedule an appointment for future refills. Thank you. 07/21/23   Pricilla Riffle, MD  aspirin EC 81 MG tablet Take 1 tablet (81 mg total) by mouth daily at 6 (six) AM. Swallow whole. 10/11/22   Rhyne, Ames Coupe, PA-C  atenolol (TENORMIN) 100 MG tablet Take 100 mg by mouth daily. 10/17/20   [provider]  benazepril (LOTENSIN) 40 MG  tablet Take 40 mg by mouth daily. 09/12/20   [provider]  Calcium Carb-Cholecalciferol (CALCIUM 600/VITAMIN D) 600-10 MG-MCG TABS Take 1 tablet by mouth daily.    [provider]  clopidogrel (PLAVIX) 75 MG tablet TAKE 1 TABLET BY MOUTH ONCE DAILY AT  6  IN  THE  MORNING 10/21/23   Maeola Harman, MD  ezetimibe (ZETIA) 10 MG tablet Take 10 mg by mouth daily. 11/03/22   [provider]  predniSONE (STERAPRED UNI-PAK 21 TAB) 10 MG (21) TBPK tablet Follow instructions on package Patient not taking: Reported on 10/25/2023 11/18/22   Maeola Harman, MD  umeclidinium-vilanterol [facility] ELLIPTA) 62.5-25 MCG/ACT AEPB Inhale 1 puff into the lungs daily. Patient not taking: Reported on 10/25/2023 09/10/21   Hunsucker, Lesia Sago, MD  venlafaxine XR (EFFEXOR-XR) 150 MG 24 hr capsule Take 150 mg by mouth daily. with food 08/03/20   [provider]    Physical Exam: Vitals:   11/01/23 1817 11/01/23 1930 11/01/23 2030 11/01/23 2131  BP:  (!) 120/97 (!) 158/55 (!) 147/65  Pulse:  69 66 64  Resp:  18 15 16   Temp:    (!) 97.5 F (36.4 C)  TempSrc:    Oral  SpO2:  (!) 86% 95% 96%  Weight: 69.9 kg      Physical Exam Constitutional:      Appearance: She is normal weight.  HENT:     Head: Normocephalic.     Nose: Nose normal.     Mouth/Throat:     Mouth: Mucous membranes are moist.     Pharynx: Oropharynx is clear.  Eyes:     Conjunctiva/sclera: Conjunctivae normal.     Pupils: Pupils are equal, round, and reactive to light.  Cardiovascular:     Rate and Rhythm: Normal rate and regular rhythm.     Pulses: Normal pulses.     Heart sounds: Normal heart sounds.  Pulmonary:     Effort: Pulmonary effort is normal.  Abdominal:     General: Abdomen is flat. Bowel sounds are normal.  Musculoskeletal:        General: Normal range of motion.  Skin:    General: Skin is warm.  Neurological:     General: No focal deficit present.     Mental Status: She  is alert and oriented to person, place, and time.  Psychiatric:        Mood and Affect: Mood normal.        Labs on Admission: I have personally reviewed the patients's labs and imaging studies.  Assessment/Plan Principal Problem:   Acute on chronic anemia Active Problems:   Hematochezia   # Symptomatic anemia #acute on chrnoic anemia - Patient presented with weakness and falls found to have a hemoglobin of 6.7 - Patient denies any joint pain and has normal range of motion in hips  and lower extremities - Reported history of prior GI bleeding - Last colonoscopy 5 years ago with report unavailable - Suspicion for acute GI bleed is low given normal rectal exam, normal fecal occult Plan: Prep for colonoscopy per GI recommendation GoLytely ordered Transfuse 1 unit Check hemoglobin in morning NPO except meds  # Generalized anxiety-continue Xanax  # Hypertension-continue amlodipine, atenolol, BenzePrO  # Hyperlipidemia-continue Zetia  # Depression-continue venlafaxine  # Peripheral vascular disease status post stenting-continue aspirin, hold Plavix given upcoming colonoscopy  # Hyponatremia-patient found to have sodium 126 with baseline around 130.  Will hydrate overnight with IV fluids and recheck in morning.  Urine studies are currently pending   Admission status: Inpatient Telemetry  Certification: The appropriate patient status for this patient is INPATIENT. Inpatient status is judged to be reasonable and necessary in order to provide the required intensity of service to ensure the patient's safety. The patient's presenting symptoms, physical exam findings, and initial radiographic and laboratory data in the context of their chronic comorbidities is felt to place them at high risk for further clinical deterioration. Furthermore, it is not anticipated that the patient will be medically stable for discharge from the hospital within 2 midnights of admission.   * I certify  that at the point of admission it is my clinical judgment that the patient will require inpatient hospital care spanning beyond 2 midnights from the point of admission due to high intensity of service, high risk for further deterioration and high frequency of surveillance required.Alan Mulder MD [facility] If 7PM-7AM, please contact night-coverage [email or web address]  11/01/2023, 9:49 PM

## 2023-11-01 NOTE — ED Provider Notes (Signed)
 [place] EMERGENCY DEPARTMENT AT [facility] Provider Note   CSN: [id number] Arrival date & time: 11/01/23  1805     History  Chief Complaint  Patient presents with   Abnormal Labs   Fall    Andrea Little is a 73 y.o. female.  73 yo F with a chief complaints of a fall.  She says she falls all the time.  This is nothing new for her.  She said almost a week ago she was up and walking and she lost her balance and fell to the ground.  She denies any specific injury from the fall.  Went to see her family doctor for what sounds like a routine checkup and was found to have a hemoglobin of 6.7.  She has a history of GI bleeding but denies any bleeding recently.  She does feel like she had a pretty large bruise to her right hip after the fall.  Has chronic abdominal pain she does not feel is changed.  Chronic back pain she do not think significantly changed.  She was sent here for admission and blood transfusion.   Fall       Home Medications Prior to Admission medications   Medication Sig Start Date End Date Taking? Authorizing Provider  ALPRAZolam Prudy Feeler) 1 MG tablet Take 1 mg by mouth 3 (three) times daily. 09/26/20   [provider]  amLODipine (NORVASC) 5 MG tablet Take 1 tablet (5 mg total) by mouth 2 (two) times daily. Please call [phone] to schedule an appointment for future refills. Thank you. 07/21/23   Pricilla Riffle, MD  aspirin EC 81 MG tablet Take 1 tablet (81 mg total) by mouth daily at 6 (six) AM. Swallow whole. 10/11/22   Rhyne, Ames Coupe, PA-C  atenolol (TENORMIN) 100 MG tablet Take 100 mg by mouth daily. 10/17/20   [provider]  benazepril (LOTENSIN) 40 MG tablet Take 40 mg by mouth daily. 09/12/20   [provider]  Calcium Carb-Cholecalciferol (CALCIUM 600/VITAMIN D) 600-10 MG-MCG TABS Take 1 tablet by mouth daily.    [provider]  clopidogrel (PLAVIX) 75 MG tablet TAKE 1 TABLET BY MOUTH ONCE DAILY AT  6  IN  THE   MORNING 10/21/23   Maeola Harman, MD  ezetimibe (ZETIA) 10 MG tablet Take 10 mg by mouth daily. 11/03/22   [provider]  predniSONE (STERAPRED UNI-PAK 21 TAB) 10 MG (21) TBPK tablet Follow instructions on package Patient not taking: Reported on 10/25/2023 11/18/22   Maeola Harman, MD  umeclidinium-vilanterol [facility] ELLIPTA) 62.5-25 MCG/ACT AEPB Inhale 1 puff into the lungs daily. Patient not taking: Reported on 10/25/2023 09/10/21   Hunsucker, Lesia Sago, MD  venlafaxine XR (EFFEXOR-XR) 150 MG 24 hr capsule Take 150 mg by mouth daily. with food 08/03/20   [provider]      Allergies    Atorvastatin, Hydrochlorothiazide, Penicillin g, and Rosuvastatin    Review of Systems   Review of Systems  Physical Exam Updated Vital Signs BP (!) 147/65 (BP Location: Left Arm)   Pulse 64   Temp (!) 97.5 F (36.4 C) (Oral)   Resp 16   Wt 69.9 kg   SpO2 96%   BMI 23.42 kg/m  Physical Exam Vitals and nursing note reviewed.  Constitutional:      General: She is not in acute distress.    Appearance: She is well-developed. She is not diaphoretic.  HENT:     Head: Normocephalic and atraumatic.  Eyes:     Pupils: Pupils are equal, round, and reactive to light.  Cardiovascular:     Rate and Rhythm: Normal rate and regular rhythm.     Heart sounds: No murmur heard.    No friction rub. No gallop.  Pulmonary:     Effort: Pulmonary effort is normal.     Breath sounds: No wheezing or rales.  Abdominal:     General: There is no distension.     Palpations: Abdomen is soft.     Tenderness: There is no abdominal tenderness.  Musculoskeletal:        General: No tenderness.     Cervical back: Normal range of motion and neck supple.     Comments: Hematoma to the right lateral hip  Skin:    General: Skin is warm and dry.  Neurological:     Mental Status: She is alert and oriented to person, place, and time.  Psychiatric:        Behavior: Behavior normal.      ED Results / Procedures / Treatments   Labs (all labs ordered are listed, but only abnormal results are displayed) Labs Reviewed  COMPREHENSIVE METABOLIC PANEL WITH GFR - Abnormal; Notable for the following components:      Result Value   Sodium 124 (*)    Chloride 93 (*)    CO2 21 (*)    Creatinine, Ser 1.10 (*)    AST 12 (*)    GFR, Estimated 53 (*)    All other components within normal limits  CBC WITH DIFFERENTIAL/PLATELET - Abnormal; Notable for the following components:   RBC 3.73 (*)    Hemoglobin 6.7 (*)    HCT 23.9 (*)    MCV 64.1 (*)    MCH 18.0 (*)    MCHC 28.0 (*)    RDW 17.2 (*)    Platelets 462 (*)    All other components within normal limits  BASIC METABOLIC PANEL WITH GFR - Abnormal; Notable for the following components:   Sodium 126 (*)    Chloride 96 (*)    CO2 21 (*)    Creatinine, Ser 1.03 (*)    GFR, Estimated 57 (*)    All other components within normal limits  OCCULT BLOOD X 1 CARD TO LAB, STOOL  BASIC METABOLIC PANEL WITH GFR  BASIC METABOLIC PANEL WITH GFR  URINALYSIS, ROUTINE W REFLEX MICROSCOPIC  SODIUM, URINE, RANDOM  OSMOLALITY, URINE  OSMOLALITY  TYPE AND SCREEN    EKG None  Radiology No results found.  Procedures .Critical Care  Performed by: Melene Plan, DO Authorized by: Melene Plan, DO   Critical care provider statement:    Critical care time (minutes):  35   Critical care time was exclusive of:  Separately billable procedures and treating other patients   Critical care was time spent personally by me on the following activities:  Development of treatment plan with patient or surrogate, discussions with consultants, evaluation of patient's response to treatment, examination of patient, ordering and review of laboratory studies, ordering and review of radiographic studies, ordering and performing treatments and interventions, pulse oximetry, re-evaluation of patient's condition and review of old charts   Care discussed  with: admitting provider       Medications Ordered in ED Medications  0.9 %  sodium chloride infusion (has no administration in time range)  sodium chloride 0.9 % bolus 1,000 mL (0 mLs Intravenous Stopped 11/01/23 2040)    ED Course/ Medical Decision Making/ A&P  Medical Decision Making Amount and/or Complexity of Data Reviewed Labs: ordered.  Risk Prescription drug management. Decision regarding hospitalization.   73 yo F with a chief complaints of hemoglobin of 6.7.  This was found at her doctor's office today.  She was then told to come to the ED for transfusion and admission.  She has been struggling with bright red blood per rectum off and on and has a scheduled colonoscopy in about 5 days.  She denies any current bleeding.  Says last time she had bleeding was in December.  Will recheck labs here.  Hemoglobin here is 6.7.  Stool soft and brown.  Also no Hemoccult.  Secure chat message was sent to Dr. Moss Mc GI.  Will discuss with medicine for admission.  The patients results and plan were reviewed and discussed.   Any x-rays performed were independently reviewed by myself.   Differential diagnosis were considered with the presenting HPI.  Medications  0.9 %  sodium chloride infusion (has no administration in time range)  sodium chloride 0.9 % bolus 1,000 mL (0 mLs Intravenous Stopped 11/01/23 2040)    Vitals:   11/01/23 1817 11/01/23 1930 11/01/23 2030 11/01/23 2131  BP:  (!) 120/97 (!) 158/55 (!) 147/65  Pulse:  69 66 64  Resp:  18 15 16   Temp:    (!) 97.5 F (36.4 C)  TempSrc:    Oral  SpO2:  (!) 86% 95% 96%  Weight: 69.9 kg       Final diagnoses:  Acute on chronic anemia    Admission/ observation were discussed with the admitting physician, patient and/or family and they are comfortable with the plan.          Final Clinical Impression(s) / ED Diagnoses Final diagnoses:  Acute on chronic anemia    Rx  / DC Orders ED Discharge Orders     None         Melene Plan, DO 11/01/23 2134

## 2023-11-01 NOTE — ED Notes (Signed)
 Report called over to [facility] and given to [place]

## 2023-11-01 NOTE — Plan of Care (Addendum)
 Plan of Care Note for accepted transfer  Patient: Andrea Little              MWU:[id number]  DOA: 11/01/2023     Facility requesting transfer: [facility] emergency department Requesting Provider: Dr. Adela Lank  Reason for transfer: Acute on chronic anemia-probably GI source, hip hematoma hematoma from fall, recurrent fall and hyponatremia.  ED triage note:  HGB 6.7.  Hx GI bleed HX AA and stent- now on Plavix. Reports frequent falls-last week fell on Wed- unsure if she hit her head- does complain of back pain.    Facility course: 73 year old female history of lower GI bleed,emphysema/COPD, chronic smoking, CKD 3A, peripheral vascular disease status post common iliac artery and renal artery graft and stent placement and hx of AAA with endovascular repair presented emergency department complaining of fall.   She says she falls all the time.  This is nothing new for her.  She said almost a week ago she was up and walking and she lost her balance and fell to the ground.  She denies any specific injury from the fall.  Went to see her family doctor for what sounds like a routine checkup and was found to have a hemoglobin of 6.7.  She has a history of GI bleeding but denies any bleeding recently.  She does feel like she had a pretty large bruise to her right hip after the fall.  Has chronic abdominal pain she does not feel is changed.  Chronic back pain she do not think significantly changed.  She was sent here for admission and blood transfusion.   At presentation to ED patient is hemodynamically stable however O2 sat dropped to 86% room air not sure it has been checked afterward or not.  FOBT negative. CBC showing low hemoglobin 6.7.  Baseline hemoglobin around 8.3-9 0.72 to 3 months ago and before that hemoglobin is around 13-15 1 year ago.  Low MCV 64 and low hematocrit 23.  Normal WBC and platelet count.  CMP showing low sodium 124, low chloride 93, creatinine 1.1 and GFR 43 at baseline.  CT angio  abdomen pelvis findings following:  IMPRESSION: VASCULAR  1. Successful interval endovascular aortic repair of abdominal aortic aneurysm. The excluded aneurysm sac has decreased in size to 5.0 cm and there is no evidence of endoleak. 2. 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 No acute dissection or other significant stenosis.   NON-VASCULAR   1. No acute abnormality within the abdomen or pelvis. 2. Stable left renal atrophy. 3. Additional ancillary findings as above without significant interval change.   CT abdomen pelvis finding normal evidence of GI bleed.  Dr. Adela Lank has been discussed case with on-call GI Dr. Lavon Paganini.  Patient already following outpatient GI and has been planned for colonoscopy on 11/05/2023.  GI recommended as patient is already here in the hospital will do colonoscopy tomorrow 4/1.  Informed Dr. Adela Lank to get the blood transfusion however there is only 2 unit of blood transfusion available at [facility] ED.  Patient is currently hemodynamically stable.  Requested bed to progressive unit.  However by the meantime the patient's hemodynamic status deteriorated need to transfer from ED to ED rather than waiting at [facility] ED for blood transfusion. In ED patient has been given 1 L of NS bolus. Checking another BMP. Recommending to start low-dose NS 75 cc/h and monitor BMP every 4 hour.   Hospitalist has been consulted for further evaluation management of acute on chronic  anemia from GI source, recurrent fall and hyponatremia.   Plan of care: The patient is accepted for admission for inpatient status to Progressive unit, at [facility].  [facility] will assume care on arrival to accepting facility. Until arrival, care as per EDP. However, [facility] available 24/7 for questions and assistance.   Check [email or web address] for on-call coverage.   Nursing staff, please call [facility] Admits & Consults System-Wide number under  Amion on patient's arrival so appropriate admitting provider can evaluate the pt.    Author: Tereasa Coop, MD  11/01/2023  [facility]

## 2023-11-01 NOTE — ED Notes (Signed)
 Pt placed on 2L [place]. Pt had varying O2 Sat. Ranging from 88%-100% on RA

## 2023-11-01 NOTE — ED Triage Notes (Signed)
 HGB 6.7.  Hx GI bleed HX AA and stent- now on Plavix. Reports frequent falls-last week fell on Wed- unsure if she hit her head- does complain of back pain.

## 2023-11-02 ENCOUNTER — Encounter (HOSPITAL_COMMUNITY): Payer: Self-pay | Admitting: Internal Medicine

## 2023-11-02 DIAGNOSIS — D649 Anemia, unspecified: Secondary | ICD-10-CM | POA: Diagnosis not present

## 2023-11-02 DIAGNOSIS — K921 Melena: Secondary | ICD-10-CM

## 2023-11-02 DIAGNOSIS — Z7901 Long term (current) use of anticoagulants: Secondary | ICD-10-CM | POA: Diagnosis not present

## 2023-11-02 LAB — CBC
HCT: 29.3 % — ABNORMAL LOW (ref 36.0–46.0)
Hemoglobin: 8.5 g/dL — ABNORMAL LOW (ref 12.0–15.0)
MCH: 20.4 pg — ABNORMAL LOW (ref 26.0–34.0)
MCHC: 29 g/dL — ABNORMAL LOW (ref 30.0–36.0)
MCV: 70.4 fL — ABNORMAL LOW (ref 80.0–100.0)
Platelets: 424 10*3/uL — ABNORMAL HIGH (ref 150–400)
RBC: 4.16 MIL/uL (ref 3.87–5.11)
RDW: 21.1 % — ABNORMAL HIGH (ref 11.5–15.5)
WBC: 6.5 10*3/uL (ref 4.0–10.5)
nRBC: 0 % (ref 0.0–0.2)

## 2023-11-02 LAB — BASIC METABOLIC PANEL WITH GFR
Anion gap: 7 (ref 5–15)
BUN: 12 mg/dL (ref 8–23)
CO2: 20 mmol/L — ABNORMAL LOW (ref 22–32)
Calcium: 9.2 mg/dL (ref 8.9–10.3)
Chloride: 105 mmol/L (ref 98–111)
Creatinine, Ser: 0.95 mg/dL (ref 0.44–1.00)
GFR, Estimated: >60 mL/min (ref >60)
Glucose, Bld: 83 mg/dL (ref 70–99)
Potassium: 4 mmol/L (ref 3.5–5.1)
Sodium: 132 mmol/L — ABNORMAL LOW (ref 135–145)

## 2023-11-02 LAB — OSMOLALITY, URINE: Osmolality, Ur: 96 mosm/kg — ABNORMAL LOW (ref 300–900)

## 2023-11-02 NOTE — Progress Notes (Signed)
 Call placed to Dr Laurey Morale office for clarification on when pt should stop plavix prior to colonoscopy scheduled for 11/09/23. Office will call pt tomorrow with this information.

## 2023-11-02 NOTE — TOC Transition Note (Signed)
 Transition of Care [facility]) - Discharge Note   Patient Details  Name: Andrea Little MRN: [id number] Date of Birth: [birth year]  [facility] CM/SW Contact:  Lanier Clam, RN Phone Number: 11/02/2023, 3:07 PM   Clinical Narrative:   Sherron Monday t patient about d/c plans-d/c plan home. Will check for [facility] agency for HHPT,aide-[facility] rep Marylene Land, aide-no preference. Has own transport home.No further CMneeds.    Final next level of care: Home w Home Health Services Barriers to Discharge: No Barriers Identified   Patient Goals and CMS Choice Patient states their goals for this hospitalization and ongoing recovery are:: Home CMS Medicare.gov Compare Post Acute Care list provided to:: Patient Choice offered to / list presented to : Patient [place] ownership interest in [facility].provided to:: Patient    Discharge Placement                       Discharge Plan and Services Additional resources added to the After Visit Summary for     Discharge Planning Services: CM Consult Post Acute Care Choice: Home Health                               Social Drivers of Health (SDOH) Interventions SDOH Screenings   Food Insecurity: No Food Insecurity (11/02/2023)  Housing: Low Risk  (11/02/2023)  Transportation Needs: No Transportation Needs (11/02/2023)  Utilities: Not At Risk (11/02/2023)  Tobacco Use: High Risk (11/02/2023)     Readmission Risk Interventions     No data to display

## 2023-11-02 NOTE — Plan of Care (Signed)
  Problem: Education: Goal: Knowledge of General Education information will improve Description: Including pain rating scale, medication(s)/side effects and non-pharmacologic comfort measures Outcome: Adequate for Discharge   Problem: Health Behavior/Discharge Planning: Goal: Ability to manage health-related needs will improve Outcome: Adequate for Discharge   Problem: Health Behavior/Discharge Planning: Goal: Ability to manage health-related needs will improve Outcome: Adequate for Discharge   Problem: Health Behavior/Discharge Planning: Goal: Ability to manage health-related needs will improve Outcome: Adequate for Discharge   Problem: Clinical Measurements: Goal: Ability to maintain clinical measurements within normal limits will improve Outcome: Adequate for Discharge Goal: Will remain free from infection Outcome: Adequate for Discharge Goal: Diagnostic test results will improve Outcome: Adequate for Discharge Goal: Respiratory complications will improve Outcome: Adequate for Discharge Goal: Cardiovascular complication will be avoided Outcome: Adequate for Discharge   Problem: Activity: Goal: Risk for activity intolerance will decrease Outcome: Adequate for Discharge   Problem: Nutrition: Goal: Adequate nutrition will be maintained Outcome: Adequate for Discharge   Problem: Coping: Goal: Level of anxiety will decrease Outcome: Adequate for Discharge   Problem: Elimination: Goal: Will not experience complications related to bowel motility Outcome: Adequate for Discharge Goal: Will not experience complications related to urinary retention Outcome: Adequate for Discharge   Problem: Pain Managment: Goal: General experience of comfort will improve and/or be controlled Outcome: Adequate for Discharge   Problem: Safety: Goal: Ability to remain free from injury will improve Outcome: Adequate for Discharge   Problem: Skin Integrity: Goal: Risk for impaired skin  integrity will decrease Outcome: Adequate for Discharge

## 2023-11-02 NOTE — Care Management CC44 (Signed)
 Condition Code 44 Documentation Completed  Patient Details  Name: Andrea Little MRN: [id number] Date of Birth: [birth year]   Condition Code 44 given:  Yes Patient signature on Condition Code 44 notice:  Yes Documentation of 2 MD's agreement:  Yes Code 44 added to claim:  Yes    Lanier Clam, RN 11/02/2023, 3:01 PM

## 2023-11-02 NOTE — Evaluation (Signed)
 Physical Therapy Evaluation Patient Details Name: Andrea Little MRN: [id number] DOB: Jan 01, 1951 Today's Date: 11/02/2023  History of Present Illness  74 y.o. female with medical history significant of COPD, CKD stage IIIa, peripheral vascular disease on dual antiplatelet therapy who presents emerged part with a fall.  Patient states she has frequent falls and is felt especially lightheaded, found to have a hemoglobin 6.7.  She has a history of recurrent GI bleeding.  Clinical Impression  Patient presents with generalized weakness, moves slowly, speaks slowly.' Patient reports multiple falls at home. Patient uses a rollator at baseline. Patient resides with spouse who works, no other caregivers. Encouraged patient to only ambulate with her rollator and to not  take risks for activities when spouse is gone. Recommend HhPT.          If plan is discharge home, recommend the following: Assistance with cooking/housework;A little help with bathing/dressing/bathroom;Assist for transportation;Help with stairs or ramp for entrance   Can travel by private vehicle        Equipment Recommendations None recommended by PT  Recommendations for Other Services       Functional Status Assessment Patient has had a recent decline in their functional status and demonstrates the ability to make significant improvements in function in a reasonable and predictable amount of time.     Precautions / Restrictions Precautions Precautions: Fall Precaution/Restrictions Comments: reports multiple  falss, one just prior to admission Restrictions Weight Bearing Restrictions Per Provider Order: No      Mobility  Bed Mobility Overal bed mobility: Modified Independent                  Transfers Overall transfer level: Needs assistance Equipment used: Rolling walker (2 wheels) Transfers: Sit to/from Stand Sit to Stand: Supervision                Ambulation/Gait Ambulation/Gait assistance:  Supervision Gait Distance (Feet): 20 Feet (then 80) Assistive device: Rolling walker (2 wheels) Gait Pattern/deviations: Step-through pattern, Trunk flexed Gait velocity: decr     General Gait Details: slow speed  Stairs            Wheelchair Mobility     Tilt Bed    Modified Rankin (Stroke Patients Only)       Balance Overall balance assessment: Mild deficits observed, not formally tested                                           Pertinent Vitals/Pain Pain Assessment Pain Assessment: No/denies pain    Home Living Family/patient expects to be discharged to:: Private residence Living Arrangements: Spouse/significant other   Type of Home: House Home Access: Stairs to enter Entrance Stairs-Rails: Doctor, general practice of Steps: 3   Home Layout: One level Home Equipment: Rollator (4 wheels)      Prior Function Prior Level of Function : Needs assist             Mobility Comments: ambulates with Rollatr ADLs Comments: independnet, does not cook     Extremity/Trunk Assessment   Upper Extremity Assessment Upper Extremity Assessment: Generalized weakness (right UE bruising)    Lower Extremity Assessment Lower Extremity Assessment: Generalized weakness    Cervical / Trunk Assessment Cervical / Trunk Assessment: Kyphotic  Communication   Communication Communication: No apparent difficulties    Cognition Arousal: Alert Behavior During Therapy: WFL for tasks assessed/performed  PT - Cognitive impairments: No family/caregiver present to determine baseline                       PT - Cognition Comments: patient speaks very slowly, indicates that she is alone when spouse works. Following commands: Intact       Cueing       General Comments General comments (skin integrity, edema, etc.): able to stand and perform hygiene, stand at sink to wash hands    Exercises     Assessment/Plan    PT Assessment  All further PT needs can be met in the next venue of care  PT Problem List Decreased strength;Decreased activity tolerance;Decreased balance;Decreased mobility;Decreased knowledge of precautions;Decreased skin integrity       PT Treatment Interventions      PT Goals (Current goals can be found in the Care Plan section)  Acute Rehab PT Goals Patient Stated Goal: go home PT Goal Formulation: All assessment and education complete, DC therapy    Frequency       Co-evaluation               AM-PAC PT "6 Clicks" Mobility  Outcome Measure Help needed turning from your back to your side while in a flat bed without using bedrails?: None Help needed moving from lying on your back to sitting on the side of a flat bed without using bedrails?: None Help needed moving to and from a bed to a chair (including a wheelchair)?: None Help needed standing up from a chair using your arms (e.g., wheelchair or bedside chair)?: A Little Help needed to walk in hospital room?: A Little Help needed climbing 3-5 steps with a railing? : A Little 6 Click Score: 21    End of Session Equipment Utilized During Treatment: Gait belt Activity Tolerance: Patient tolerated treatment well Patient left: in bed;with call bell/phone within reach;with bed alarm set Nurse Communication: Mobility status PT Visit Diagnosis: Unsteadiness on feet (R26.81);History of falling (Z91.81)    Time: 1413-1440 PT Time Calculation (min) (ACUTE ONLY): 27 min   Charges:   PT Evaluation $PT Eval Low Complexity: 1 Low PT Treatments $Gait Training: 8-22 mins PT General Charges $$ ACUTE PT VISIT: 1 Visit         Blanchard Kelch PT Acute Rehabilitation Services Office [phone] Weekend [phone]   Rada Hay 11/02/2023, 4:26 PM

## 2023-11-02 NOTE — Progress Notes (Signed)
 Call placed to spouse, Shar Paez, to notify of DC. No answer. VM left per pt request while at bedside.

## 2023-11-02 NOTE — Plan of Care (Signed)
  Problem: Coping: °Goal: Level of anxiety will decrease °Outcome: Progressing °  °Problem: Elimination: °Goal: Will not experience complications related to bowel motility °Outcome: Progressing °  °Problem: Safety: °Goal: Ability to remain free from injury will improve °Outcome: Progressing °  °Problem: Skin Integrity: °Goal: Risk for impaired skin integrity will decrease °Outcome: Progressing °  °

## 2023-11-02 NOTE — Consult Note (Signed)
 [facility] Consultation Note  Referring Provider: [facility] Primary Care Physician:  Ollen Bowl, MD Primary Gastroenterologist:  Dr. Marca Ancona  Reason for Consultation:  anemia  HPI: Andrea Little is a 73 y.o. female admitted interval anemia.  Has colonoscopy for anemia next week.  Chronic lower abdominal discomfort.  No hematemesis, hematochezia.  FOBT negative.  Recent fall with hematoma right hip.   Past Medical History:  Diagnosis Date   AAA (abdominal aortic aneurysm) (HCC)    Anxiety    Arthritis    Back pain    Cancer (HCC)    skin   Chronic kidney disease    COPD (chronic obstructive pulmonary disease) (HCC)    Dyspnea    Hyperlipidemia    Hypertension    Major depression    Mood disorder (HCC)    Pneumonia 1984   Tremor     Past Surgical History:  Procedure Laterality Date   ABDOMINAL AORTIC ENDOVASCULAR STENT GRAFT N/A 10/09/2022   Procedure: ABDOMINAL AORTIC ENDOVASCULAR STENT GRAFT;  Surgeon: Maeola Harman, MD;  Location: [facility] OR;  Service: Vascular;  Laterality: N/A;   ULTRASOUND GUIDANCE FOR VASCULAR ACCESS Bilateral 10/09/2022   Procedure: ULTRASOUND GUIDANCE FOR VASCULAR ACCESS, BILATERAL FEMORAL ARTERIES;  Surgeon: Maeola Harman, MD;  Location: [facility] OR;  Service: Vascular;  Laterality: Bilateral;    Prior to Admission medications   Medication Sig Start Date End Date Taking? Authorizing Provider  ALPRAZolam Prudy Feeler) 1 MG tablet Take 1 mg by mouth 3 (three) times daily. 09/26/20   [provider]  amLODipine (NORVASC) 5 MG tablet Take 1 tablet (5 mg total) by mouth 2 (two) times daily. Please call [phone] to schedule an appointment for future refills. Thank you. 07/21/23   Pricilla Riffle, MD  aspirin EC 81 MG tablet Take 1 tablet (81 mg total) by mouth daily at 6 (six) AM. Swallow whole. 10/11/22   Rhyne, Ames Coupe, PA-C  atenolol (TENORMIN) 100 MG tablet Take 100 mg by mouth daily. 10/17/20   [provider]  benazepril (LOTENSIN) 40 MG tablet Take 40 mg by mouth daily. 09/12/20   [provider]  Calcium Carb-Cholecalciferol (CALCIUM 600/VITAMIN D) 600-10 MG-MCG TABS Take 1 tablet by mouth daily.    [provider]  clopidogrel (PLAVIX) 75 MG tablet TAKE 1 TABLET BY MOUTH ONCE DAILY AT  6  IN  THE  MORNING 10/21/23   Maeola Harman, MD  ezetimibe (ZETIA) 10 MG tablet Take 10 mg by mouth daily. 11/03/22   [provider]  predniSONE (STERAPRED UNI-PAK 21 TAB) 10 MG (21) TBPK tablet Follow instructions on package Patient not taking: Reported on 10/25/2023 11/18/22   Maeola Harman, MD  umeclidinium-vilanterol [facility] ELLIPTA) 62.5-25 MCG/ACT AEPB Inhale 1 puff into the lungs daily. Patient not taking: Reported on 10/25/2023 09/10/21   Hunsucker, Lesia Sago, MD  venlafaxine XR (EFFEXOR-XR) 150 MG 24 hr capsule Take 150 mg by mouth daily. with food 08/03/20   [provider]    Current Facility-Administered Medications  Medication Dose Route Frequency Provider Last Rate Last Admin   0.9 %  sodium chloride infusion (Manually program via Guardrails IV Fluids)   Intravenous Once Alan Mulder, MD       0.9 %  sodium chloride infusion   Intravenous Continuous Alan Mulder, MD 100 mL/hr at 11/02/23 1056 New Bag at 11/02/23 1056   acetaminophen (TYLENOL) tablet 650 mg  650 mg Oral Q6H PRN Alan Mulder, MD  Or   acetaminophen (TYLENOL) suppository 650 mg  650 mg Rectal Q6H PRN Alan Mulder, MD       ALPRAZolam Prudy Feeler) tablet 1 mg  1 mg Oral TID Alan Mulder, MD   1 mg at 11/02/23 0908   amLODipine (NORVASC) tablet 5 mg  5 mg Oral BID Alan Mulder, MD   5 mg at 11/02/23 1610   aspirin EC tablet 81 mg  81 mg Oral Daily Alan Mulder, MD   81 mg at 11/02/23 9604   atenolol (TENORMIN) tablet 100 mg  100 mg Oral Daily Alan Mulder, MD   100 mg at 11/02/23 5409   benazepril (LOTENSIN) tablet 40 mg  40 mg Oral Daily Alan Mulder, MD   40 mg at 11/02/23 8119   ezetimibe (ZETIA) tablet 10 mg  10 mg Oral Daily Alan Mulder, MD   10 mg at 11/02/23 0908   ondansetron (ZOFRAN) tablet 4 mg  4 mg Oral Q6H PRN Alan Mulder, MD       Or   ondansetron (ZOFRAN) injection 4 mg  4 mg Intravenous Q6H PRN Alan Mulder, MD       oxyCODONE (Oxy IR/ROXICODONE) immediate release tablet 5 mg  5 mg Oral Q4H PRN Alan Mulder, MD       venlafaxine XR (EFFEXOR-XR) 24 hr capsule 150 mg  150 mg Oral Q breakfast Alan Mulder, MD   150 mg at 11/02/23 1478    Allergies as of 11/01/2023 - Review Complete 11/01/2023  Allergen Reaction Noted   Atorvastatin  07/16/2020   Hydrochlorothiazide  07/16/2020   Penicillin g  04/14/2016   Rosuvastatin  11/18/2022    No family history on file.  Social History   Socioeconomic History   Marital status: Married    Spouse name: Not on file   Number of children: Not on file   Years of education: Not on file   Highest education level: Not on file  Occupational History   Not on file  Tobacco Use   Smoking status: Every Day    Current packs/day: 1.00    Types: Cigarettes   Smokeless tobacco: Never   Tobacco comments:    1 ppd   Vaping Use   Vaping status: Never Used  Substance and Sexual Activity   Alcohol use: Not Currently   Drug use: Never   Sexual activity: Not on file  Other Topics Concern   Not on file  Social History Narrative   Not on file   Social Drivers of Health   Financial Resource Strain: Not on file  Food Insecurity: No Food Insecurity (11/02/2023)   Hunger Vital Sign    Worried About Running Out of Food in the Last Year: Never true    Ran Out of Food in the Last Year: Never true  Transportation Needs: No Transportation Needs (11/02/2023)   PRAPARE - Administrator, Civil Service (Medical): No    Lack of Transportation (Non-Medical): No  Physical Activity: Not on file  Stress: Not on file  Social Connections: Not on file  Intimate  Partner Violence: Unknown (11/02/2023)   Humiliation, Afraid, Rape, and Kick questionnaire    Fear of Current or Ex-Partner: No    Emotionally Abused: No    Physically Abused: Not on file    Sexually Abused: No    Review of Systems: As per HPI, all others negative  Physical Exam: Vital signs in last 24 hours: Temp:  [97.5 F (36.4 C)-98.7 F (37.1 C)]  98.7 F (37.1 C) (04/01 0354) Pulse Rate:  [57-76] 76 (04/01 0905) Resp:  [15-20] 15 (04/01 0354) BP: (120-160)/(55-97) 160/67 (04/01 0906) SpO2:  [86 %-99 %] 93 % (04/01 0354) Weight:  [69.9 kg] 69.9 kg (03/31 1817) Last BM Date : 11/02/23 General:   Alert,  frail-appearing, older-appearing than stated age Head:  Normocephalic and atraumatic. Eyes:  Sclera clear, no icterus.   Conjunctiva pale Ears:  Normal auditory acuity. Nose:  No deformity, discharge,  or lesions. Mouth:  No deformity or lesions.  Oropharynx pale, dry Neck:  Supple; no masses or thyromegaly. Lungs:  Baseline pursed-lip breath and dyspnea (chronic) Abdomen:  Soft, nontender and nondistended. No masses, hepatosplenomegaly or hernias noted. No peritonitis Msk: Baseball-sized hematoma right hip, otherwise symmetrical without gross deformities. Normal posture. Pulses:  Normal pulses noted. Extremities:  Without clubbing or edema. Neurologic:  Alert and  oriented x4;  grossly normal neurologically. Skin:  Intact without significant lesions or rashes. Psych:  Alert and cooperative. Normal mood and affect.   Lab Results: Recent Labs    11/01/23 1816 11/02/23 0516  WBC 8.3 6.5  HGB 6.7* 8.5*  HCT 23.9* 29.3*  PLT 462* 424*   BMET Recent Labs    11/01/23 1816 11/01/23 2026 11/02/23 0516  NA 124* 126* 132*  K 4.0 3.8 4.0  CL 93* 96* 105  CO2 21* 21* 20*  GLUCOSE 95 79 83  BUN 14 14 12   CREATININE 1.10* 1.03* 0.95  CALCIUM 9.2 9.1 9.2   LFT Recent Labs    11/01/23 1816  PROT 7.0  ALBUMIN 4.4  AST 12*  ALT 10  ALKPHOS 104  BILITOT 0.3    PT/INR No results for input(s): "LABPROT", "INR" in the last 72 hours.  Studies/Results: No results found.  Impression:   Acute on chronic anemia.  No overt bleeding.  Suspect recent interval decrease in hemoglobin likely from her right hip hematoma.  FOBT negative. Multiple cardiopulmonary comorbidities.  Plan:   I do not see need for inpatient colonoscopy.  She has colonoscopy as outpatient set up for next Monday 4/8 with Dr. Marca Ancona. Advance diet as tolerated. Patient can be discharged and have outpatient colonoscopy next week as planned with Dr. Marca Ancona. [facility] will sign off.  Thank you for the consultation.   LOS: 1 day   Jaziya Obarr M  11/02/2023, 12:03 PM  Cell [phone] If no answer or after 5 PM call [phone]

## 2023-11-02 NOTE — Care Management Obs Status (Signed)
 MEDICARE OBSERVATION STATUS NOTIFICATION   Patient Details  Name: Saumya Hukill MRN: [id number] Date of Birth: 01-16-1951   Medicare Observation Status Notification Given:  Yes    Lanier Clam, RN 11/02/2023, 2:57 PM

## 2023-11-02 NOTE — Care Management Obs Status (Signed)
 MEDICARE OBSERVATION STATUS NOTIFICATION   Patient Details  Name: Andrea Little MRN: [id number] Date of Birth: 1951-03-06   Medicare Observation Status Notification Given:  Yes    Lanier Clam, RN 11/02/2023, 2:58 PM

## 2023-11-02 NOTE — Discharge Summary (Signed)
 Physician Discharge Summary   Patient: Andrea Little MRN: [id number] DOB: [birth year]  Admit date:     11/01/2023  Discharge date: 11/02/23  Discharge Physician: Meredeth Ide   PCP: Ollen Bowl, MD   Recommendations at discharge:   Follow-up [facility] as outpatient Patient to go home with home health PT  Discharge Diagnoses: Principal Problem:   Acute on chronic anemia Active Problems:   Hematochezia   Anemia  Resolved Problems:   * No resolved hospital problems. *  Hospital Course:  73 y.o. female with medical history significant of COPD, CKD stage IIIa, peripheral vascular disease on dual antiplatelet therapy who presents emerged part with a fall.  Patient states she has frequent falls and is felt especially lightheaded.  She went to her family medicine doctor for further assessment and she was found to have a hemoglobin 6.7.  She has a history of recurrent GI bleeding.    Assessment and Plan:  Symptomatic anemia #acute on chrnoic anemia - Patient presented with weakness and falls found to have a hemoglobin of 6.7 - Patient denies any joint pain and has normal range of motion in hips and lower extremities - Reported history of prior GI bleeding - Last colonoscopy 5 years ago with report unavailable - Suspicion for acute GI bleed is low given normal rectal exam, normal fecal occult -Seen by [facility] gastroenterology.  No plan for colonoscopy in hospital -She has colonoscopy scheduled on 11/09/2023 at [facility] office. -Status post 1 unit PRBC; hemoglobin improved to 8.5  # Generalized anxiety-continue Xanax   # Hypertension-continue amlodipine, atenolol, BenzePrO   # Hyperlipidemia-continue Zetia   # Depression-continue venlafaxine   # Peripheral vascular disease status post stenting-continue aspirin, patient will check with [facility] when to stop Plavix before procedure   # Hyponatremia-presented with sodium of 126, improved to 132.  With hydration with  normal saline.          Consultants: Gastroenterology Procedures performed:  Disposition: Home Diet recommendation:  Discharge Diet Orders (From admission, onward)     Start     Ordered   11/02/23 0000  Diet - low sodium heart healthy        11/02/23 1341           Regular diet DISCHARGE MEDICATION: Allergies as of 11/02/2023       Reactions   Atorvastatin    Other reaction(s): leg cramps   Hydrochlorothiazide    Other reaction(s): hyponatremia   Penicillin G    Other reaction(s): Other (See Comments) As a child does not recall the reaction    Rosuvastatin    Other Reaction(s): muscle aches        Medication List     TAKE these medications    ALPRAZolam 1 MG tablet Commonly known as: XANAX Take 1 mg by mouth 3 (three) times daily.   amLODipine 5 MG tablet Commonly known as: NORVASC Take 1 tablet (5 mg total) by mouth 2 (two) times daily. Please call [phone] to schedule an appointment for future refills. Thank you.   Anoro Ellipta 62.5-25 MCG/ACT Aepb Generic drug: umeclidinium-vilanterol Inhale 1 puff into the lungs daily.   aspirin EC 81 MG tablet Take 1 tablet (81 mg total) by mouth daily at 6 (six) AM. Swallow whole.   atenolol 100 MG tablet Commonly known as: TENORMIN Take 100 mg by mouth daily.   benazepril 40 MG tablet Commonly known as: LOTENSIN Take 40 mg by mouth daily.   Calcium 600/Vitamin  D 600-10 MG-MCG Tabs Generic drug: Calcium Carb-Cholecalciferol Take 1 tablet by mouth daily.   clopidogrel 75 MG tablet Commonly known as: PLAVIX TAKE 1 TABLET BY MOUTH ONCE DAILY AT  6  IN  THE  MORNING   ezetimibe 10 MG tablet Commonly known as: ZETIA Take 10 mg by mouth daily.   predniSONE 10 MG (21) Tbpk tablet Commonly known as: STERAPRED UNI-PAK 21 TAB Follow instructions on package   venlafaxine XR 150 MG 24 hr capsule Commonly known as: EFFEXOR-XR Take 150 mg by mouth daily. with food        Discharge Exam: Ceasar Mons  Weights   11/01/23 1817 11/02/23 1237  Weight: 69.9 kg 69.8 kg   General-appears in no acute distress Heart-S1-S2, regular, no murmur auscultated Lungs-clear to auscultation bilaterally, no wheezing or crackles auscultated Abdomen-soft, nontender, no organomegaly Extremities-no edema in the lower extremities Neuro-alert, oriented x3, no focal deficit noted  Condition at discharge: good  The results of significant diagnostics from this hospitalization (including imaging, microbiology, ancillary and laboratory) are listed below for reference.   Imaging Studies: No results found.  Microbiology: Results for orders placed or performed during the hospital encounter of 10/07/22  Surgical pcr screen     Status: None   Collection Time: 10/07/22  8:28 AM   Specimen: Nasal Mucosa; Nasal Swab  Result Value Ref Range Status   MRSA, PCR NEGATIVE NEGATIVE Final   Staphylococcus aureus NEGATIVE NEGATIVE Final    Comment: (NOTE) The Xpert SA Assay (FDA approved for NASAL specimens in patients 42 years of age and older), is one component of a comprehensive surveillance program. It is not intended to diagnose infection nor to guide or monitor treatment. Performed at [facility], [address], [place], [place] [address]     Labs: CBC: Recent Labs  Lab 11/01/23 1816 11/02/23 0516  WBC 8.3 6.5  NEUTROABS 5.1  --   HGB 6.7* 8.5*  HCT 23.9* 29.3*  MCV 64.1* 70.4*  PLT 462* 424*   Basic Metabolic Panel: Recent Labs  Lab 11/01/23 1816 11/01/23 2026 11/02/23 0516  NA 124* 126* 132*  K 4.0 3.8 4.0  CL 93* 96* 105  CO2 21* 21* 20*  GLUCOSE 95 79 83  BUN 14 14 12   CREATININE 1.10* 1.03* 0.95  CALCIUM 9.2 9.1 9.2   Liver Function Tests: Recent Labs  Lab 11/01/23 1816  AST 12*  ALT 10  ALKPHOS 104  BILITOT 0.3  PROT 7.0  ALBUMIN 4.4   CBG: No results for input(s): "GLUCAP" in the last 168 hours.  Discharge time spent: greater than 30 minutes.  Signed: Meredeth Ide, MD [facility] 11/02/2023

## 2023-11-03 LAB — TYPE AND SCREEN
ABO/RH(D): B POS
Antibody Screen: NEGATIVE
Unit division: 0

## 2023-11-03 LAB — BPAM RBC
Blood Product Expiration Date: 202505012359
ISSUE DATE / TIME: 202504010103
Unit Type and Rh: 202505012359
Unit Type and Rh: 7300

## 2023-11-04 DIAGNOSIS — F32A Depression, unspecified: Secondary | ICD-10-CM | POA: Diagnosis not present

## 2023-11-04 DIAGNOSIS — I739 Peripheral vascular disease, unspecified: Secondary | ICD-10-CM | POA: Diagnosis not present

## 2023-11-04 DIAGNOSIS — Z9181 History of falling: Secondary | ICD-10-CM | POA: Diagnosis not present

## 2023-11-04 DIAGNOSIS — F419 Anxiety disorder, unspecified: Secondary | ICD-10-CM | POA: Diagnosis not present

## 2023-11-04 DIAGNOSIS — K921 Melena: Secondary | ICD-10-CM | POA: Diagnosis not present

## 2023-11-04 DIAGNOSIS — Z7982 Long term (current) use of aspirin: Secondary | ICD-10-CM | POA: Diagnosis not present

## 2023-11-04 DIAGNOSIS — J449 Chronic obstructive pulmonary disease, unspecified: Secondary | ICD-10-CM | POA: Diagnosis not present

## 2023-11-04 DIAGNOSIS — Z7902 Long term (current) use of antithrombotics/antiplatelets: Secondary | ICD-10-CM | POA: Diagnosis not present

## 2023-11-04 DIAGNOSIS — N1831 Chronic kidney disease, stage 3a: Secondary | ICD-10-CM | POA: Diagnosis not present

## 2023-11-04 DIAGNOSIS — D649 Anemia, unspecified: Secondary | ICD-10-CM | POA: Diagnosis not present

## 2023-11-04 DIAGNOSIS — I129 Hypertensive chronic kidney disease with stage 1 through stage 4 chronic kidney disease, or unspecified chronic kidney disease: Secondary | ICD-10-CM | POA: Diagnosis not present

## 2023-11-08 ENCOUNTER — Telehealth: Payer: Self-pay

## 2023-11-08 NOTE — Telephone Encounter (Signed)
   Pre-operative Risk Assessment    Patient Name: Andrea Little  DOB: 1950-11-29 MRN: [id number]   Date of last office visit: 10/25/23 [name], PAC Date of next office visit: NONE-PT DOES HAVE AN ECHO SCHED 11/18/23 STATED FOR PREOP   Request for Surgical Clearance    Procedure:   COLONOSCOPY/ENDOSCOPY ; (ANEMIA, RECTAL BLEEDING, +  Date of Surgery:  Clearance 12/07/23                                Surgeon:  DR. [facility] Sur[facility]'s Group or Practice Name:  [facility] Phone number:  [phone] Fax number:  [phone]   Type of Clearance Requested:   - Medical  - Pharmacy:  Hold Clopidogrel (Plavix) x  5 DAYS PRIOR   Type of Anesthesia:   PROPOFOL   Additional requests/questions:    [name]   11/08/2023, 1:24 PM

## 2023-11-09 ENCOUNTER — Encounter (HOSPITAL_COMMUNITY): Payer: Self-pay

## 2023-11-09 ENCOUNTER — Ambulatory Visit (HOSPITAL_COMMUNITY): Admit: 2023-11-09 | Payer: Medicare HMO | Admitting: Gastroenterology

## 2023-11-09 DIAGNOSIS — Z7902 Long term (current) use of antithrombotics/antiplatelets: Secondary | ICD-10-CM | POA: Diagnosis not present

## 2023-11-09 DIAGNOSIS — D649 Anemia, unspecified: Secondary | ICD-10-CM | POA: Diagnosis not present

## 2023-11-09 DIAGNOSIS — Z9181 History of falling: Secondary | ICD-10-CM | POA: Diagnosis not present

## 2023-11-09 DIAGNOSIS — Z7982 Long term (current) use of aspirin: Secondary | ICD-10-CM | POA: Diagnosis not present

## 2023-11-09 DIAGNOSIS — K921 Melena: Secondary | ICD-10-CM | POA: Diagnosis not present

## 2023-11-09 DIAGNOSIS — I739 Peripheral vascular disease, unspecified: Secondary | ICD-10-CM | POA: Diagnosis not present

## 2023-11-09 DIAGNOSIS — F32A Depression, unspecified: Secondary | ICD-10-CM | POA: Diagnosis not present

## 2023-11-09 DIAGNOSIS — J449 Chronic obstructive pulmonary disease, unspecified: Secondary | ICD-10-CM | POA: Diagnosis not present

## 2023-11-09 DIAGNOSIS — I129 Hypertensive chronic kidney disease with stage 1 through stage 4 chronic kidney disease, or unspecified chronic kidney disease: Secondary | ICD-10-CM | POA: Diagnosis not present

## 2023-11-09 DIAGNOSIS — F419 Anxiety disorder, unspecified: Secondary | ICD-10-CM | POA: Diagnosis not present

## 2023-11-09 DIAGNOSIS — N1831 Chronic kidney disease, stage 3a: Secondary | ICD-10-CM | POA: Diagnosis not present

## 2023-11-09 SURGERY — COLONOSCOPY WITH PROPOFOL
Anesthesia: Monitor Anesthesia Care

## 2023-11-11 DIAGNOSIS — E871 Hypo-osmolality and hyponatremia: Secondary | ICD-10-CM | POA: Diagnosis not present

## 2023-11-11 DIAGNOSIS — D649 Anemia, unspecified: Secondary | ICD-10-CM | POA: Diagnosis not present

## 2023-11-12 DIAGNOSIS — Z7902 Long term (current) use of antithrombotics/antiplatelets: Secondary | ICD-10-CM | POA: Diagnosis not present

## 2023-11-12 DIAGNOSIS — Z9181 History of falling: Secondary | ICD-10-CM | POA: Diagnosis not present

## 2023-11-12 DIAGNOSIS — F419 Anxiety disorder, unspecified: Secondary | ICD-10-CM | POA: Diagnosis not present

## 2023-11-12 DIAGNOSIS — N1831 Chronic kidney disease, stage 3a: Secondary | ICD-10-CM | POA: Diagnosis not present

## 2023-11-12 DIAGNOSIS — D649 Anemia, unspecified: Secondary | ICD-10-CM | POA: Diagnosis not present

## 2023-11-12 DIAGNOSIS — F32A Depression, unspecified: Secondary | ICD-10-CM | POA: Diagnosis not present

## 2023-11-12 DIAGNOSIS — J449 Chronic obstructive pulmonary disease, unspecified: Secondary | ICD-10-CM | POA: Diagnosis not present

## 2023-11-12 DIAGNOSIS — I739 Peripheral vascular disease, unspecified: Secondary | ICD-10-CM | POA: Diagnosis not present

## 2023-11-12 DIAGNOSIS — I129 Hypertensive chronic kidney disease with stage 1 through stage 4 chronic kidney disease, or unspecified chronic kidney disease: Secondary | ICD-10-CM | POA: Diagnosis not present

## 2023-11-12 DIAGNOSIS — Z7982 Long term (current) use of aspirin: Secondary | ICD-10-CM | POA: Diagnosis not present

## 2023-11-12 DIAGNOSIS — K921 Melena: Secondary | ICD-10-CM | POA: Diagnosis not present

## 2023-11-16 DIAGNOSIS — K921 Melena: Secondary | ICD-10-CM | POA: Diagnosis not present

## 2023-11-16 DIAGNOSIS — F32A Depression, unspecified: Secondary | ICD-10-CM | POA: Diagnosis not present

## 2023-11-16 DIAGNOSIS — J449 Chronic obstructive pulmonary disease, unspecified: Secondary | ICD-10-CM | POA: Diagnosis not present

## 2023-11-16 DIAGNOSIS — N1831 Chronic kidney disease, stage 3a: Secondary | ICD-10-CM | POA: Diagnosis not present

## 2023-11-16 DIAGNOSIS — I739 Peripheral vascular disease, unspecified: Secondary | ICD-10-CM | POA: Diagnosis not present

## 2023-11-16 DIAGNOSIS — D649 Anemia, unspecified: Secondary | ICD-10-CM | POA: Diagnosis not present

## 2023-11-16 DIAGNOSIS — Z9181 History of falling: Secondary | ICD-10-CM | POA: Diagnosis not present

## 2023-11-16 DIAGNOSIS — Z7982 Long term (current) use of aspirin: Secondary | ICD-10-CM | POA: Diagnosis not present

## 2023-11-16 DIAGNOSIS — I129 Hypertensive chronic kidney disease with stage 1 through stage 4 chronic kidney disease, or unspecified chronic kidney disease: Secondary | ICD-10-CM | POA: Diagnosis not present

## 2023-11-16 DIAGNOSIS — Z7902 Long term (current) use of antithrombotics/antiplatelets: Secondary | ICD-10-CM | POA: Diagnosis not present

## 2023-11-16 DIAGNOSIS — F419 Anxiety disorder, unspecified: Secondary | ICD-10-CM | POA: Diagnosis not present

## 2023-11-17 ENCOUNTER — Other Ambulatory Visit: Payer: Self-pay | Admitting: Vascular Surgery

## 2023-11-18 ENCOUNTER — Ambulatory Visit (HOSPITAL_COMMUNITY): Attending: Physician Assistant

## 2023-11-18 DIAGNOSIS — I739 Peripheral vascular disease, unspecified: Secondary | ICD-10-CM | POA: Diagnosis not present

## 2023-11-18 DIAGNOSIS — Z7902 Long term (current) use of antithrombotics/antiplatelets: Secondary | ICD-10-CM | POA: Diagnosis not present

## 2023-11-18 DIAGNOSIS — Z9181 History of falling: Secondary | ICD-10-CM | POA: Diagnosis not present

## 2023-11-18 DIAGNOSIS — D649 Anemia, unspecified: Secondary | ICD-10-CM | POA: Diagnosis not present

## 2023-11-18 DIAGNOSIS — I129 Hypertensive chronic kidney disease with stage 1 through stage 4 chronic kidney disease, or unspecified chronic kidney disease: Secondary | ICD-10-CM | POA: Diagnosis not present

## 2023-11-18 DIAGNOSIS — R0609 Other forms of dyspnea: Secondary | ICD-10-CM | POA: Diagnosis not present

## 2023-11-18 DIAGNOSIS — F419 Anxiety disorder, unspecified: Secondary | ICD-10-CM | POA: Diagnosis not present

## 2023-11-18 DIAGNOSIS — N1831 Chronic kidney disease, stage 3a: Secondary | ICD-10-CM | POA: Diagnosis not present

## 2023-11-18 DIAGNOSIS — F32A Depression, unspecified: Secondary | ICD-10-CM | POA: Diagnosis not present

## 2023-11-18 DIAGNOSIS — J449 Chronic obstructive pulmonary disease, unspecified: Secondary | ICD-10-CM | POA: Diagnosis not present

## 2023-11-18 DIAGNOSIS — Z7982 Long term (current) use of aspirin: Secondary | ICD-10-CM | POA: Diagnosis not present

## 2023-11-18 DIAGNOSIS — K921 Melena: Secondary | ICD-10-CM | POA: Diagnosis not present

## 2023-11-18 LAB — ECHOCARDIOGRAM COMPLETE
Area-P 1/2: 3.12 cm2
S' Lateral: 2.2 cm

## 2023-11-19 ENCOUNTER — Encounter: Payer: Self-pay | Admitting: Physician Assistant

## 2023-11-19 ENCOUNTER — Other Ambulatory Visit: Payer: Self-pay

## 2023-11-19 DIAGNOSIS — I77810 Thoracic aortic ectasia: Secondary | ICD-10-CM | POA: Insufficient documentation

## 2023-11-23 NOTE — Telephone Encounter (Signed)
 Haskell Linker with [facility], Dr. Shelle Devon office, is following up requesting updates on clearance. Please advise.  Phone number:  [phone] Fax number:  [phone]

## 2023-11-23 NOTE — Telephone Encounter (Signed)
   Patient Name: Andrea Little  DOB: 03/07/1951 MRN: [id number]  Primary Cardiologist: None  Chart reviewed as part of pre-operative protocol coverage.  Patient was last seen in the office on 10/25/2023 by Lovette Rud, PA-C.  She was cleared for procedure pending echocardiogram.  Recent echocardiogram was stable.  Therefore, given past medical history and time since last visit, based on ACC/AHA guidelines, Andrea Little is at acceptable risk for the planned procedure without further cardiovascular testing.   Patient's aspirin  and Plavix  are managed by vascular surgery.  Recommendations for holding aspirin  and Plavix  prior to procedure should come from managing provider (vascular).  I will route this recommendation to the requesting party via Epic fax function and remove from pre-op pool.  Please call with questions.  Jude Norton, NP 11/23/2023, 4:22 PM

## 2023-11-23 NOTE — Telephone Encounter (Signed)
 Forward to preop APP Marlana Silvan, PA-C patient was seen on 10/25/23 and was recommended to get an echo Sherline Distel will finalize clearance and send it over to requesting office

## 2023-11-24 DIAGNOSIS — Z7902 Long term (current) use of antithrombotics/antiplatelets: Secondary | ICD-10-CM | POA: Diagnosis not present

## 2023-11-24 DIAGNOSIS — N1831 Chronic kidney disease, stage 3a: Secondary | ICD-10-CM | POA: Diagnosis not present

## 2023-11-24 DIAGNOSIS — Z7982 Long term (current) use of aspirin: Secondary | ICD-10-CM | POA: Diagnosis not present

## 2023-11-24 DIAGNOSIS — F32A Depression, unspecified: Secondary | ICD-10-CM | POA: Diagnosis not present

## 2023-11-24 DIAGNOSIS — F419 Anxiety disorder, unspecified: Secondary | ICD-10-CM | POA: Diagnosis not present

## 2023-11-24 DIAGNOSIS — I739 Peripheral vascular disease, unspecified: Secondary | ICD-10-CM | POA: Diagnosis not present

## 2023-11-24 DIAGNOSIS — J449 Chronic obstructive pulmonary disease, unspecified: Secondary | ICD-10-CM | POA: Diagnosis not present

## 2023-11-24 DIAGNOSIS — Z9181 History of falling: Secondary | ICD-10-CM | POA: Diagnosis not present

## 2023-11-24 DIAGNOSIS — K921 Melena: Secondary | ICD-10-CM | POA: Diagnosis not present

## 2023-11-24 DIAGNOSIS — D649 Anemia, unspecified: Secondary | ICD-10-CM | POA: Diagnosis not present

## 2023-11-24 DIAGNOSIS — I129 Hypertensive chronic kidney disease with stage 1 through stage 4 chronic kidney disease, or unspecified chronic kidney disease: Secondary | ICD-10-CM | POA: Diagnosis not present

## 2023-11-24 NOTE — Telephone Encounter (Signed)
 Note routed back to requesting office to make them aware.

## 2023-11-24 NOTE — Telephone Encounter (Signed)
 Office called back to say that the clearance has to specify the amount of days the medication can be held. Please advise

## 2023-12-02 DIAGNOSIS — I739 Peripheral vascular disease, unspecified: Secondary | ICD-10-CM | POA: Diagnosis not present

## 2023-12-02 DIAGNOSIS — J449 Chronic obstructive pulmonary disease, unspecified: Secondary | ICD-10-CM | POA: Diagnosis not present

## 2023-12-02 DIAGNOSIS — D649 Anemia, unspecified: Secondary | ICD-10-CM | POA: Diagnosis not present

## 2023-12-02 DIAGNOSIS — K921 Melena: Secondary | ICD-10-CM | POA: Diagnosis not present

## 2023-12-02 DIAGNOSIS — Z9181 History of falling: Secondary | ICD-10-CM | POA: Diagnosis not present

## 2023-12-02 DIAGNOSIS — N1831 Chronic kidney disease, stage 3a: Secondary | ICD-10-CM | POA: Diagnosis not present

## 2023-12-02 DIAGNOSIS — I129 Hypertensive chronic kidney disease with stage 1 through stage 4 chronic kidney disease, or unspecified chronic kidney disease: Secondary | ICD-10-CM | POA: Diagnosis not present

## 2023-12-02 DIAGNOSIS — Z7902 Long term (current) use of antithrombotics/antiplatelets: Secondary | ICD-10-CM | POA: Diagnosis not present

## 2023-12-02 DIAGNOSIS — Z7982 Long term (current) use of aspirin: Secondary | ICD-10-CM | POA: Diagnosis not present

## 2023-12-02 DIAGNOSIS — F419 Anxiety disorder, unspecified: Secondary | ICD-10-CM | POA: Diagnosis not present

## 2023-12-02 DIAGNOSIS — F32A Depression, unspecified: Secondary | ICD-10-CM | POA: Diagnosis not present

## 2023-12-06 ENCOUNTER — Other Ambulatory Visit: Payer: Self-pay

## 2023-12-06 DIAGNOSIS — I7142 Juxtarenal abdominal aortic aneurysm, without rupture: Secondary | ICD-10-CM

## 2023-12-14 DIAGNOSIS — N1831 Chronic kidney disease, stage 3a: Secondary | ICD-10-CM | POA: Diagnosis not present

## 2023-12-14 DIAGNOSIS — Z7982 Long term (current) use of aspirin: Secondary | ICD-10-CM | POA: Diagnosis not present

## 2023-12-14 DIAGNOSIS — Z9181 History of falling: Secondary | ICD-10-CM | POA: Diagnosis not present

## 2023-12-14 DIAGNOSIS — I129 Hypertensive chronic kidney disease with stage 1 through stage 4 chronic kidney disease, or unspecified chronic kidney disease: Secondary | ICD-10-CM | POA: Diagnosis not present

## 2023-12-14 DIAGNOSIS — D649 Anemia, unspecified: Secondary | ICD-10-CM | POA: Diagnosis not present

## 2023-12-14 DIAGNOSIS — F32A Depression, unspecified: Secondary | ICD-10-CM | POA: Diagnosis not present

## 2023-12-14 DIAGNOSIS — F419 Anxiety disorder, unspecified: Secondary | ICD-10-CM | POA: Diagnosis not present

## 2023-12-14 DIAGNOSIS — K921 Melena: Secondary | ICD-10-CM | POA: Diagnosis not present

## 2023-12-14 DIAGNOSIS — I739 Peripheral vascular disease, unspecified: Secondary | ICD-10-CM | POA: Diagnosis not present

## 2023-12-14 DIAGNOSIS — Z7902 Long term (current) use of antithrombotics/antiplatelets: Secondary | ICD-10-CM | POA: Diagnosis not present

## 2023-12-14 DIAGNOSIS — J449 Chronic obstructive pulmonary disease, unspecified: Secondary | ICD-10-CM | POA: Diagnosis not present

## 2023-12-15 ENCOUNTER — Ambulatory Visit: Payer: Medicare HMO | Attending: Vascular Surgery | Admitting: Physician Assistant

## 2023-12-15 ENCOUNTER — Ambulatory Visit (HOSPITAL_COMMUNITY)
Admission: RE | Admit: 2023-12-15 | Discharge: 2023-12-15 | Disposition: A | Discharge status: Home / Self Care | Payer: Medicare HMO | Source: Ambulatory Visit | Attending: Vascular Surgery | Admitting: Vascular Surgery

## 2023-12-15 VITALS — BP 145/62 | HR 62 | Temp 97.6°F | Ht 69.0 in | Wt 148.2 lb

## 2023-12-15 DIAGNOSIS — I7142 Juxtarenal abdominal aortic aneurysm, without rupture: Secondary | ICD-10-CM

## 2023-12-15 DIAGNOSIS — Z9889 Other specified postprocedural states: Secondary | ICD-10-CM | POA: Diagnosis not present

## 2023-12-15 DIAGNOSIS — Z8679 Personal history of other diseases of the circulatory system: Secondary | ICD-10-CM

## 2023-12-15 NOTE — Progress Notes (Signed)
 Office Note     CC:  follow up Requesting Provider:  Elester Grim, MD  HPI: Andrea Little is a 73 y.o. (10-04-1950) female who presents for surveillance of endovascular repair of 5.5 cm abdominal aortic aneurysm.  This was performed by Dr. Vikki Graves on 10/09/2022.  Patient denies any new or changing abdominal or back pain.  She does have chronic back pain however this is unchanged.  She is ambulatory with a walker and is accompanied by her mother-in-law today.  She is currently being worked up for occult GI bleeding.  She is taking aspirin  and statin daily.  She is also on Plavix  daily.   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   TTE 11/18/23: EF 60-65, no RWMA, mild LVH, Gr 1 DD, NL RVSF, mild LAE, trivial MR, mild AV Ca2+, trivial AI, AV sclerosis, borderline dilation of aorta (38 mm)    Back pain    Cancer (HCC)    skin   Chronic kidney disease    COPD (chronic obstructive pulmonary disease) (HCC)    Dyspnea    Hyperlipidemia    Hypertension    Major depression    Mood disorder (HCC)    Pneumonia 1984   Tremor     Past Surgical History:  Procedure Laterality Date   ABDOMINAL AORTIC ENDOVASCULAR STENT GRAFT N/A 10/09/2022   Procedure: ABDOMINAL AORTIC ENDOVASCULAR STENT GRAFT;  Surgeon: Adine Hoof, MD;  Location: [facility] OR;  Service: Vascular;  Laterality: N/A;   ULTRASOUND GUIDANCE FOR VASCULAR ACCESS Bilateral 10/09/2022   Procedure: ULTRASOUND GUIDANCE FOR VASCULAR ACCESS, BILATERAL FEMORAL ARTERIES;  Surgeon: Adine Hoof, MD;  Location: [facility] OR;  Service: Vascular;  Laterality: Bilateral;    Social History   Socioeconomic History   Marital status: Married    Spouse name: Not on file   Number of children: Not on file   Years of education: Not on file   Highest education level: Not on file  Occupational History   Not on file  Tobacco Use    Smoking status: Every Day    Current packs/day: 1.00    Types: Cigarettes   Smokeless tobacco: Never   Tobacco comments:    1 ppd   Vaping Use   Vaping status: Never Used  Substance and Sexual Activity   Alcohol use: Not Currently   Drug use: Never   Sexual activity: Not on file  Other Topics Concern   Not on file  Social History Narrative   Not on file   Social Drivers of Health   Financial Resource Strain: Not on file  Food Insecurity: No Food Insecurity (11/02/2023)   Hunger Vital Sign    Worried About Running Out of Food in the Last Year: Never true    Ran Out of Food in the Last Year: Never true  Transportation Needs: No Transportation Needs (11/02/2023)   PRAPARE - Administrator, Civil Service (Medical): No    Lack of Transportation (Non-Medical): No  Physical Activity: Not on file  Stress: Not on file  Social Connections: Not on file  Intimate Partner Violence: Unknown (11/02/2023)   Humiliation, Afraid, Rape, and Kick questionnaire    Fear of Current or Ex-Partner: No    Emotionally Abused: No    Physically Abused: Not on file    Sexually Abused: No   History reviewed. No  pertinent family history.  Current Outpatient Medications  Medication Sig Dispense Refill   ALPRAZolam  (XANAX ) 1 MG tablet Take 1 mg by mouth 3 (three) times daily.     amLODipine  (NORVASC ) 5 MG tablet Take 1 tablet (5 mg total) by mouth 2 (two) times daily. Please call [phone] to schedule an appointment for future refills. Thank you. 60 tablet 0   aspirin  EC 81 MG tablet Take 1 tablet (81 mg total) by mouth daily at 6 (six) AM. Swallow whole. 30 tablet 12   atenolol  (TENORMIN ) 100 MG tablet Take 100 mg by mouth daily.     benazepril  (LOTENSIN ) 40 MG tablet Take 40 mg by mouth daily.     Calcium  Carb-Cholecalciferol (CALCIUM  600/VITAMIN D) 600-10 MG-MCG TABS Take 1 tablet by mouth daily.     clopidogrel  (PLAVIX ) 75 MG tablet TAKE 1 TABLET BY MOUTH ONCE DAILY AT  6  AM 30 tablet 0    ezetimibe  (ZETIA ) 10 MG tablet Take 10 mg by mouth daily.     predniSONE  (STERAPRED UNI-PAK 21 TAB) 10 MG (21) TBPK tablet Follow instructions on package 1 each 0   umeclidinium-vilanterol (ANORO ELLIPTA ) 62.5-25 MCG/ACT AEPB Inhale 1 puff into the lungs daily. 60 each 11   venlafaxine  XR (EFFEXOR -XR) 150 MG 24 hr capsule Take 150 mg by mouth daily. with food     No current facility-administered medications for this visit.    Allergies  Allergen Reactions   Atorvastatin     Other reaction(s): leg cramps   Hydrochlorothiazide     Other reaction(s): hyponatremia   Penicillin G     Other reaction(s): Other (See Comments) As a child does not recall the reaction    Rosuvastatin      Other Reaction(s): muscle aches     REVIEW OF SYSTEMS:   [X]  denotes positive finding, [ ]  denotes negative finding Cardiac  Comments:  Chest pain or chest pressure:    Shortness of breath upon exertion:    Short of breath when lying flat:    Irregular heart rhythm:        Vascular    Pain in calf, thigh, or hip brought on by ambulation:    Pain in feet at night that wakes you up from your sleep:     Blood clot in your veins:    Leg swelling:         Pulmonary    Oxygen at home:    Productive cough:     Wheezing:         Neurologic    Sudden weakness in arms or legs:     Sudden numbness in arms or legs:     Sudden onset of difficulty speaking or slurred speech:    Temporary loss of vision in one eye:     Problems with dizziness:         Gastrointestinal    Blood in stool:     Vomited blood:         Genitourinary    Burning when urinating:     Blood in urine:        Psychiatric    Major depression:         Hematologic    Bleeding problems:    Problems with blood clotting too easily:        Skin    Rashes or ulcers:        Constitutional    Fever or chills:      PHYSICAL EXAMINATION:  Vitals:  12/15/23 1043  BP: (!) 145/62  Pulse: 62  Temp: 97.6 F (36.4 C)  TempSrc:  Temporal  SpO2: 94%  Weight: 148 lb 3.2 oz (67.2 kg)  Height: 5\' 9"  (1.753 m)    General:  WDWN in NAD; vital signs documented above Gait: Not observed HENT: WNL, normocephalic Pulmonary: normal non-labored breathing , without Rales, rhonchi,  wheezing Cardiac: regular HR Abdomen: soft, NT, no masses Skin: without rashes Vascular Exam/Pulses: feet warm and well perfused Extremities: without ischemic changes, without Gangrene , without cellulitis; without open wounds;  Musculoskeletal: no muscle wasting or atrophy  Neurologic: A&O X 3 Psychiatric:  The pt has Normal affect.   Non-Invasive Vascular Imaging:   Patent EVAR and left iliac stents without any evidence of endoleak.  Largest aortic diameter is 3.3 cm    ASSESSMENT/PLAN:: 73 y.o. female here for follow up for surveillance of EVAR  AAA was repaired in March 2024.  Duplex demonstrates shrinking AAA sac now measuring 3.3 cm in largest diameter.  Repair is patent without any areas of stenosis.  We will repeat duplex in another year.  Okay to discontinue Plavix  due to anemic workup.  Recommend continuing 81 mg aspirin  daily.   Cordie Deters, PA-C [facility] [phone]  Clinic MD:   Vikki Graves

## 2024-01-20 DIAGNOSIS — M47816 Spondylosis without myelopathy or radiculopathy, lumbar region: Secondary | ICD-10-CM | POA: Diagnosis not present

## 2024-01-20 DIAGNOSIS — M5451 Vertebrogenic low back pain: Secondary | ICD-10-CM | POA: Diagnosis not present

## 2024-03-29 DIAGNOSIS — H52203 Unspecified astigmatism, bilateral: Secondary | ICD-10-CM | POA: Diagnosis not present

## 2024-03-29 DIAGNOSIS — H5203 Hypermetropia, bilateral: Secondary | ICD-10-CM | POA: Diagnosis not present

## 2024-03-29 DIAGNOSIS — H524 Presbyopia: Secondary | ICD-10-CM | POA: Diagnosis not present

## 2024-03-29 DIAGNOSIS — H04123 Dry eye syndrome of bilateral lacrimal glands: Secondary | ICD-10-CM | POA: Diagnosis not present

## 2024-03-29 DIAGNOSIS — H15833 Staphyloma posticum, bilateral: Secondary | ICD-10-CM | POA: Diagnosis not present

## 2024-03-30 DIAGNOSIS — Z1330 Encounter for screening examination for mental health and behavioral disorders, unspecified: Secondary | ICD-10-CM | POA: Diagnosis not present

## 2024-03-30 DIAGNOSIS — G894 Chronic pain syndrome: Secondary | ICD-10-CM | POA: Diagnosis not present

## 2024-03-30 DIAGNOSIS — M5431 Sciatica, right side: Secondary | ICD-10-CM | POA: Diagnosis not present

## 2024-03-30 DIAGNOSIS — M4326 Fusion of spine, lumbar region: Secondary | ICD-10-CM | POA: Diagnosis not present

## 2024-04-07 ENCOUNTER — Other Ambulatory Visit (HOSPITAL_BASED_OUTPATIENT_CLINIC_OR_DEPARTMENT_OTHER): Payer: Self-pay | Admitting: Internal Medicine

## 2024-04-07 ENCOUNTER — Telehealth (HOSPITAL_BASED_OUTPATIENT_CLINIC_OR_DEPARTMENT_OTHER): Payer: Self-pay

## 2024-04-07 DIAGNOSIS — E7800 Pure hypercholesterolemia, unspecified: Secondary | ICD-10-CM | POA: Diagnosis not present

## 2024-04-07 DIAGNOSIS — E559 Vitamin D deficiency, unspecified: Secondary | ICD-10-CM | POA: Diagnosis not present

## 2024-04-07 DIAGNOSIS — N183 Chronic kidney disease, stage 3 unspecified: Secondary | ICD-10-CM | POA: Diagnosis not present

## 2024-04-07 DIAGNOSIS — R600 Localized edema: Secondary | ICD-10-CM

## 2024-04-07 DIAGNOSIS — I129 Hypertensive chronic kidney disease with stage 1 through stage 4 chronic kidney disease, or unspecified chronic kidney disease: Secondary | ICD-10-CM | POA: Diagnosis not present

## 2024-04-07 DIAGNOSIS — D62 Acute posthemorrhagic anemia: Secondary | ICD-10-CM | POA: Diagnosis not present

## 2024-04-10 ENCOUNTER — Ambulatory Visit (HOSPITAL_BASED_OUTPATIENT_CLINIC_OR_DEPARTMENT_OTHER)
Admission: RE | Admit: 2024-04-10 | Discharge: 2024-04-10 | Disposition: A | Discharge status: Home / Self Care | Source: Ambulatory Visit | Attending: Internal Medicine | Admitting: Internal Medicine

## 2024-04-10 DIAGNOSIS — R600 Localized edema: Secondary | ICD-10-CM | POA: Insufficient documentation

## 2024-05-22 DIAGNOSIS — M25551 Pain in right hip: Secondary | ICD-10-CM | POA: Diagnosis not present

## 2024-05-22 DIAGNOSIS — R296 Repeated falls: Secondary | ICD-10-CM | POA: Diagnosis not present

## 2024-05-22 DIAGNOSIS — D225 Melanocytic nevi of trunk: Secondary | ICD-10-CM | POA: Diagnosis not present

## 2024-05-22 DIAGNOSIS — G548 Other nerve root and plexus disorders: Secondary | ICD-10-CM | POA: Diagnosis not present

## 2024-05-22 DIAGNOSIS — S61511A Laceration without foreign body of right wrist, initial encounter: Secondary | ICD-10-CM | POA: Diagnosis not present

## 2024-05-22 DIAGNOSIS — L821 Other seborrheic keratosis: Secondary | ICD-10-CM | POA: Diagnosis not present

## 2024-05-22 DIAGNOSIS — M5416 Radiculopathy, lumbar region: Secondary | ICD-10-CM | POA: Diagnosis not present

## 2024-07-21 ENCOUNTER — Other Ambulatory Visit (HOSPITAL_BASED_OUTPATIENT_CLINIC_OR_DEPARTMENT_OTHER): Payer: Self-pay | Admitting: Internal Medicine

## 2024-07-21 ENCOUNTER — Ambulatory Visit (HOSPITAL_BASED_OUTPATIENT_CLINIC_OR_DEPARTMENT_OTHER)
Admission: RE | Admit: 2024-07-21 | Discharge: 2024-07-21 | Disposition: A | Discharge status: Home / Self Care | Source: Ambulatory Visit | Attending: Internal Medicine | Admitting: Internal Medicine

## 2024-07-21 DIAGNOSIS — N183 Chronic kidney disease, stage 3 unspecified: Secondary | ICD-10-CM | POA: Diagnosis not present

## 2024-07-21 DIAGNOSIS — S0990XA Unspecified injury of head, initial encounter: Secondary | ICD-10-CM | POA: Insufficient documentation

## 2024-07-21 DIAGNOSIS — S0003XA Contusion of scalp, initial encounter: Secondary | ICD-10-CM | POA: Diagnosis not present

## 2024-07-21 DIAGNOSIS — R9082 White matter disease, unspecified: Secondary | ICD-10-CM | POA: Diagnosis not present

## 2024-07-21 DIAGNOSIS — R296 Repeated falls: Secondary | ICD-10-CM | POA: Diagnosis not present

## 2024-07-21 DIAGNOSIS — I672 Cerebral atherosclerosis: Secondary | ICD-10-CM | POA: Diagnosis not present

## 2024-07-21 DIAGNOSIS — S0093XA Contusion of unspecified part of head, initial encounter: Secondary | ICD-10-CM | POA: Diagnosis not present

## 2024-07-21 DIAGNOSIS — R600 Localized edema: Secondary | ICD-10-CM | POA: Diagnosis not present
